# Patient Record
Sex: Male | Born: 1990 | Race: White | Hispanic: No | Marital: Married | Smoking: Never smoker
Health system: Southern US, Community
[De-identification: ages and names within clinical notes are randomized; demographics above are authoritative.]

## PROBLEM LIST (undated history)

## (undated) DIAGNOSIS — G4730 Sleep apnea, unspecified: Secondary | ICD-10-CM

## (undated) DIAGNOSIS — R55 Syncope and collapse: Secondary | ICD-10-CM

## (undated) HISTORY — PX: WISDOM TOOTH EXTRACTION: SHX21

## (undated) HISTORY — DX: Syncope and collapse: R55

---
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
## 2016-05-10 ENCOUNTER — Emergency Department (HOSPITAL_BASED_OUTPATIENT_CLINIC_OR_DEPARTMENT_OTHER): Payer: BLUE CROSS/BLUE SHIELD

## 2016-05-10 ENCOUNTER — Emergency Department (HOSPITAL_BASED_OUTPATIENT_CLINIC_OR_DEPARTMENT_OTHER)
Admission: EM | Admit: 2016-05-10 | Discharge: 2016-05-11 | Disposition: A | Discharge status: Home / Self Care | Payer: BLUE CROSS/BLUE SHIELD | Attending: Emergency Medicine | Admitting: Emergency Medicine

## 2016-05-10 ENCOUNTER — Encounter (HOSPITAL_BASED_OUTPATIENT_CLINIC_OR_DEPARTMENT_OTHER): Payer: Self-pay | Admitting: *Deleted

## 2016-05-10 DIAGNOSIS — S93401A Sprain of unspecified ligament of right ankle, initial encounter: Secondary | ICD-10-CM | POA: Insufficient documentation

## 2016-05-10 DIAGNOSIS — Y9389 Activity, other specified: Secondary | ICD-10-CM | POA: Insufficient documentation

## 2016-05-10 DIAGNOSIS — Y999 Unspecified external cause status: Secondary | ICD-10-CM | POA: Insufficient documentation

## 2016-05-10 DIAGNOSIS — Y929 Unspecified place or not applicable: Secondary | ICD-10-CM | POA: Diagnosis not present

## 2016-05-10 DIAGNOSIS — S99911A Unspecified injury of right ankle, initial encounter: Secondary | ICD-10-CM | POA: Diagnosis present

## 2016-05-10 DIAGNOSIS — X501XXA Overexertion from prolonged static or awkward postures, initial encounter: Secondary | ICD-10-CM | POA: Diagnosis not present

## 2016-05-10 NOTE — ED Triage Notes (Signed)
Right foot twisted on the way down steps.

## 2016-05-11 MED ORDER — DIAZEPAM 5 MG PO TABS
5.0000 mg | ORAL_TABLET | Freq: Once | ORAL | Status: AC
  Administered 2016-05-11: 5 mg via ORAL
  Filled 2016-05-11: qty 1

## 2016-05-11 MED ORDER — IBUPROFEN 600 MG PO TABS: 600.0000 mg | ORAL_TABLET | Freq: Four times a day (QID) | ORAL | 0 refills | Status: DC | PRN

## 2016-05-11 MED ORDER — METHOCARBAMOL 500 MG PO TABS: 500.0000 mg | ORAL_TABLET | Freq: Two times a day (BID) | ORAL | 0 refills | Status: DC

## 2016-05-11 NOTE — ED Notes (Signed)
ED Provider at bedside. 

## 2016-05-11 NOTE — Discharge Instructions (Signed)
Ice and elevate your ankle. Ibuprofen for pain. Robaxin for spasms. Follow with primary care doctor or orthopedic specialist.

## 2016-05-11 NOTE — ED Provider Notes (Signed)
[facility]   CSN: [id number] Arrival date & time: 05/10/16  2215     History   Chief Complaint Chief Complaint  Patient presents with  . Foot Injury    HPI John Weber is a 25 y.o. male.  HPI John Weber is a 25 y.o. male presents to emergency department complaining of right foot injury. Patient states he stepped down a step and rolled his right ankle. Patient states he heard a pop. He reports pain to the right ankle/foot. He denies numbness or weakness to his foot. He states he did not walk on it because he is worried it might be broken. Pain is sharp, in outside of his foot. Denies any other injuries. Has not tried any medications prior to coming in. Patient reports spasms and cramps in his right calf and right foot.   History reviewed. No pertinent past medical history.  There are no active problems to display for this patient.   History reviewed. No pertinent surgical history.     Home Medications    Prior to Admission medications   Not on File    Family History No family history on file.  Social History Social History  Substance Use Topics  . Smoking status: Never Smoker  . Smokeless tobacco: Never Used  . Alcohol use No     Allergies   Penicillins   Review of Systems Review of Systems  Constitutional: Negative for chills and fever.  Musculoskeletal: Positive for arthralgias and joint swelling.  Neurological: Negative for weakness and numbness.  All other systems reviewed and are negative.    Physical Exam Updated Vital Signs BP 142/96   Pulse 101   Temp 99.3 F (37.4 C) (Oral)   Resp 18   Ht 6\' 1"  (1.854 m)   Wt 95.3 kg   SpO2 100%   BMI 27.71 kg/m   Physical Exam  Constitutional: He appears well-developed and well-nourished. No distress.  HENT:  Head: Normocephalic and atraumatic.  Eyes: Conjunctivae are normal.  Neck: Neck supple.  Musculoskeletal: He exhibits no edema.  No obvious swelling to  the ankle or foot noted. No tenderness to palpation over medial lateral malleoli. No tenderness to palpation over just inferior to the lateral mild and fifth metatarsal. Pain with range of motion of the ankle, specifically with dorsiflexion and inversion of the ankle. Joint is stable. No tenderness or swelling over her Achilles tendon. Dorsal pedal pulses intact. Full range motion of all toes.  Neurological: He is alert.  Skin: Skin is warm and dry.  Nursing note and vitals reviewed.    ED Treatments / Results  Labs (all labs ordered are listed, but only abnormal results are displayed) Labs Reviewed - No data to display  EKG  EKG Interpretation None       Radiology Dg Foot Complete Right  Result Date: 05/10/2016 CLINICAL DATA:  Rolled right foot while walking down steps tonight. Lateral pain. EXAM: RIGHT FOOT COMPLETE - 3+ VIEW COMPARISON:  None. FINDINGS: There is no evidence of fracture or dislocation. There is no evidence of arthropathy or other focal bone abnormality. Soft tissues are unremarkable. IMPRESSION: Negative. Electronically Signed   By: Ellery Plunkaniel R Mitchell M.D.   On: 05/10/2016 22:51    Procedures Procedures (including critical care time)  Medications Ordered in ED Medications  diazepam (VALIUM) tablet 5 mg (not administered)     Initial Impression / Assessment and Plan / ED Course  I have reviewed the triage vital  signs and the nursing notes.  Pertinent labs & imaging results that were available during my care of the patient were reviewed by me and considered in my medical decision making (see chart for details).  Clinical Course     Patient is coming from home, complaining of right foot injury. X-ray of the foot is negative. NO tenderness over malleoli, do not think pt needs ankle xray.He is neurovascularly intact.  suspect possible ligamentous injury versus a sprain. We'll place an ASO, crutches provided. Patient is having spasms in the right calf and right  arch of the foot. I have witnessed some of this cramps while in the room. We'll treat with muscle relaxants. Will have patient follow with primary care doctor or orthopedic specialist for further evaluation and treatment.  Vitals:   05/10/16 2220 05/10/16 2222  BP:  142/96  Pulse:  101  Resp:  18  Temp:  99.3 F (37.4 C)  TempSrc:  Oral  SpO2:  100%  Weight: 95.3 kg   Height: 6\' 1"  (1.854 m)      Final Clinical Impressions(s) / ED Diagnoses   Final diagnoses:  Sprain of right ankle, unspecified ligament, initial encounter    New Prescriptions Discharge Medication List as of 05/11/2016 12:41 AM    START taking these medications   Details  ibuprofen (ADVIL,MOTRIN) 600 MG tablet Take 1 tablet (600 mg total) by mouth every 6 (six) hours as needed., Starting Tue 05/11/2016, Print    methocarbamol (ROBAXIN) 500 MG tablet Take 1 tablet (500 mg total) by mouth 2 (two) times daily., Starting Tue 05/11/2016, Print         Jaynie Crumbleatyana Jamarii Banks, PA-C 05/11/16 0104    Tomasita CrumbleAdeleke Oni, MD 05/11/16 [id number]

## 2016-10-20 ENCOUNTER — Emergency Department (HOSPITAL_COMMUNITY)
Admission: EM | Admit: 2016-10-20 | Discharge: 2016-10-20 | Disposition: A | Discharge status: Home / Self Care | Payer: BLUE CROSS/BLUE SHIELD | Attending: Emergency Medicine | Admitting: Emergency Medicine

## 2016-10-20 DIAGNOSIS — W1800XA Striking against unspecified object with subsequent fall, initial encounter: Secondary | ICD-10-CM | POA: Diagnosis not present

## 2016-10-20 DIAGNOSIS — R7989 Other specified abnormal findings of blood chemistry: Secondary | ICD-10-CM | POA: Diagnosis not present

## 2016-10-20 DIAGNOSIS — Y929 Unspecified place or not applicable: Secondary | ICD-10-CM | POA: Insufficient documentation

## 2016-10-20 DIAGNOSIS — Y9389 Activity, other specified: Secondary | ICD-10-CM | POA: Insufficient documentation

## 2016-10-20 DIAGNOSIS — S01511A Laceration without foreign body of lip, initial encounter: Secondary | ICD-10-CM | POA: Insufficient documentation

## 2016-10-20 DIAGNOSIS — R55 Syncope and collapse: Secondary | ICD-10-CM | POA: Diagnosis not present

## 2016-10-20 DIAGNOSIS — Z23 Encounter for immunization: Secondary | ICD-10-CM | POA: Diagnosis not present

## 2016-10-20 DIAGNOSIS — Y998 Other external cause status: Secondary | ICD-10-CM | POA: Diagnosis not present

## 2016-10-20 DIAGNOSIS — R945 Abnormal results of liver function studies: Secondary | ICD-10-CM

## 2016-10-20 LAB — CBC WITH DIFFERENTIAL/PLATELET
Basophils Absolute: 0.1 10*3/uL (ref 0.0–0.1)
Basophils Relative: 1 %
Eosinophils Absolute: 0.4 10*3/uL (ref 0.0–0.7)
Eosinophils Relative: 4 %
HCT: 44.5 % (ref 39.0–52.0)
Hemoglobin: 15.2 g/dL (ref 13.0–17.0)
LYMPHS PCT: 18 %
Lymphs Abs: 1.8 10*3/uL (ref 0.7–4.0)
MCH: 29.1 pg (ref 26.0–34.0)
MCHC: 34.2 g/dL (ref 30.0–36.0)
MCV: 85.2 fL (ref 78.0–100.0)
MONO ABS: 0.9 10*3/uL (ref 0.1–1.0)
MONOS PCT: 9 %
NEUTROS ABS: 6.8 10*3/uL (ref 1.7–7.7)
Neutrophils Relative %: 68 %
Platelets: 238 10*3/uL (ref 150–400)
RBC: 5.22 MIL/uL (ref 4.22–5.81)
RDW: 12.8 % (ref 11.5–15.5)
WBC: 9.9 10*3/uL (ref 4.0–10.5)

## 2016-10-20 LAB — COMPREHENSIVE METABOLIC PANEL
ALT: 100 U/L — ABNORMAL HIGH (ref 17–63)
ANION GAP: 7 (ref 5–15)
AST: 54 U/L — ABNORMAL HIGH (ref 15–41)
Albumin: 4.4 g/dL (ref 3.5–5.0)
Alkaline Phosphatase: 52 U/L (ref 38–126)
BILIRUBIN TOTAL: 0.8 mg/dL (ref 0.3–1.2)
BUN: 10 mg/dL (ref 6–20)
CO2: 27 mmol/L (ref 22–32)
Calcium: 9.1 mg/dL (ref 8.9–10.3)
Chloride: 102 mmol/L (ref 101–111)
Creatinine, Ser: 1.17 mg/dL (ref 0.61–1.24)
GFR calc Af Amer: >60 mL/min (ref >60)
Glucose, Bld: 109 mg/dL — ABNORMAL HIGH (ref 65–99)
Potassium: 3.7 mmol/L (ref 3.5–5.1)
Sodium: 136 mmol/L (ref 135–145)
TOTAL PROTEIN: 6.9 g/dL (ref 6.5–8.1)

## 2016-10-20 LAB — I-STAT TROPONIN, ED: TROPONIN I, POC: 0 ng/mL (ref 0.00–0.08)

## 2016-10-20 MED ORDER — TETANUS-DIPHTH-ACELL PERTUSSIS 5-2.5-18.5 LF-MCG/0.5 IM SUSP
0.5000 mL | Freq: Once | INTRAMUSCULAR | Status: AC
  Administered 2016-10-20: 0.5 mL via INTRAMUSCULAR
  Filled 2016-10-20: qty 0.5

## 2016-10-20 NOTE — Discharge Instructions (Signed)
Drink plenty of fluids. Apply Orajel or any other topical benzocaine to the lip pain. Rinse mouth after eating. Watch for any signs of infection. Follow with family doctor regarding elevated Liver Function Tests, prior palpitations, and this syncopal episode. Return if any worsening symptoms.

## 2016-10-20 NOTE — ED Triage Notes (Signed)
Pt arrived via ems from spouses work place after passing out and striking chin on floor. Pt has laceration to inside of lower lip and small abrasion beneath left eye. Pt was eating just prior to episode. Pt remembers only waking up on floor with a crowd standing around. Pt spouse states she saw the pt become rigid after hitting floor. Pt has no hx of cardiac, sz, or syncopal episodes. Pt is alert and oriented x4.

## 2016-10-20 NOTE — ED Provider Notes (Signed)
[facility] Provider Note   CSN: [id number] Arrival date & time: 10/20/16  1808     History   Chief Complaint Chief Complaint  Patient presents with  . Loss of Consciousness    HPI John Weber is a 26 y.o. male.  HPI John Weber is a 26 y.o. male presents to emergency department complaining of syncopal episode. Patient states he was eating food, remembers taking a big bite and having pain when he was swallowing, reports feeling dizzy, diaphoretic, and woke up on the floor. Patient's girlfriend is in the room with him and states that she witnessed him falling to the ground and being unconscious for several seconds. She states when he came back to it he was alert and oriented and did not know what happened. Patient denies any pain anywhere at this time. Denies dizziness. He admits to not drinking enough fluids today, and states he did have a beer earlier. Patient denies any chest pain or abdominal pain. No palpitations. Patient does report several episodes in the past where he would have heart racing and palpitations but states this usually lasts several seconds and improve on their own. He has not seen a cardiologist for this in the past. Patient complaining of pain to the left face and lower lip where he sustained laceration to the inner lip from his tooth.  No past medical history on file.  There are no active problems to display for this patient.   No past surgical history on file.     Home Medications    Prior to Admission medications   Medication Sig Start Date End Date Taking? Authorizing Provider  ibuprofen (ADVIL,MOTRIN) 600 MG tablet Take 1 tablet (600 mg total) by mouth every 6 (six) hours as needed. 05/11/16   Ardon Franklin, Lemont Fillers, PA-C  methocarbamol (ROBAXIN) 500 MG tablet Take 1 tablet (500 mg total) by mouth 2 (two) times daily. 05/11/16   Jaynie Crumble, PA-C    Family History No family history on file.  Social History Social History    Substance Use Topics  . Smoking status: Never Smoker  . Smokeless tobacco: Never Used  . Alcohol use No     Allergies   Penicillins   Review of Systems Review of Systems  Constitutional: Negative for chills and fever.  Respiratory: Negative for cough, chest tightness and shortness of breath.   Cardiovascular: Negative for chest pain, palpitations and leg swelling.  Gastrointestinal: Negative for abdominal distention, abdominal pain, diarrhea, nausea and vomiting.  Genitourinary: Negative for dysuria, frequency, hematuria and urgency.  Musculoskeletal: Negative for arthralgias, myalgias, neck pain and neck stiffness.  Skin: Positive for wound. Negative for rash.  Allergic/Immunologic: Negative for immunocompromised state.  Neurological: Positive for syncope and headaches. Negative for dizziness, weakness, light-headedness and numbness.  All other systems reviewed and are negative.    Physical Exam Updated Vital Signs BP 133/76   Pulse 88   Temp 97.5 F (36.4 C) (Oral)   Resp 17   Ht 6\' 1"  (1.854 m)   Wt 104.3 kg (230 lb)   SpO2 98%   BMI 30.34 kg/m   Physical Exam  Constitutional: He is oriented to person, place, and time. He appears well-developed and well-nourished. No distress.  HENT:  Head: Normocephalic and atraumatic.  Laceration to the oral mucosa of the lower lip. Measuring approximately 1 cm. Confusion around left eye from the glasses. Teeth are all intact, no loosening.  Eyes: Conjunctivae and EOM are normal. Pupils are equal, round, and  reactive to light.  Neck: Neck supple.  Cardiovascular: Normal rate, regular rhythm and normal heart sounds.   No murmur heard. Pulmonary/Chest: Effort normal. No respiratory distress. He has no wheezes. He has no rales.  Abdominal: Soft. Bowel sounds are normal. He exhibits no distension. There is no tenderness. There is no rebound.  Musculoskeletal: He exhibits no edema.  Neurological: He is alert and oriented to  person, place, and time. He displays normal reflexes. No cranial nerve deficit. Coordination normal.  Skin: Skin is warm and dry.  Nursing note and vitals reviewed.    ED Treatments / Results  Labs (all labs ordered are listed, but only abnormal results are displayed) Labs Reviewed  COMPREHENSIVE METABOLIC PANEL - Abnormal; Notable for the following:       Result Value   Glucose, Bld 109 (*)    AST 54 (*)    ALT 100 (*)    All other components within normal limits  CBC WITH DIFFERENTIAL/PLATELET  Rosezena SensorI-STAT TROPOININ, ED    EKG  EKG Interpretation  Date/Time:  Wednesday Oct 20 2016 18:24:56 EDT Ventricular Rate:  88 PR Interval:    QRS Duration: 99 QT Interval:  360 QTC Calculation: 436 R Axis:   74 Text Interpretation:  Sinus rhythm No evidence of WPW.  When compared to prior, t waves now upright in leads AVL, V1, and V2.  No STEMI Confirmed by Theda Belfastegeler, Chris ([id number]) on 10/20/2016 7:42:41 PM       Radiology No results found.  Procedures Procedures (including critical care time)  Medications Ordered in ED Medications  Tdap (BOOSTRIX) injection 0.5 mL (0.5 mLs Intramuscular Given 10/20/16 2053)     Initial Impression / Assessment and Plan / ED Course  I have reviewed the triage vital signs and the nursing notes.  Pertinent labs & imaging results that were available during my care of the patient were reviewed by me and considered in my medical decision making (see chart for details).    Patient with syncopal episode after swallowing a large food bolus and feeling pain in his chest. We'll check labs including EKG, troponin. Will monitor on the cardiac monitor. Patient is currently asymptomatic.   8:37 PM EKG is normal. Labs unremarkable other than elevated LFTs. Patient is overweight, question fatty liver versus from alcohol intake. He admits to drinking beer today. He will need further workup for these LFTs. Hold results discussed with patient. Tetanus updated.  Advised to follow-up with family doctor regarding elevated LFTs and palpitations that he has been feeling. Advised to increase fluid intake. Return if any worsening symptoms.   Vitals:   10/20/16 1809 10/20/16 1900  BP: 120/72 133/76  Pulse: 89 88  Resp: 16 17  Temp: 97.5 F (36.4 C)   TempSrc: Oral   SpO2: 98% 98%  Weight: 104.3 kg (230 lb)   Height: 6\' 1"  (1.854 m)      Final Clinical Impressions(s) / ED Diagnoses   Final diagnoses:  Syncope, unspecified syncope type  Lip laceration, initial encounter  Elevated LFTs    New Prescriptions Discharge Medication List as of 10/20/2016  8:39 PM       Jaynie CrumbleKirichenko, Daylen Hack, PA-C 10/20/16 2348    Tegeler, Canary Brimhristopher J, MD 10/21/16 [phone]

## 2016-10-28 ENCOUNTER — Encounter: Payer: Self-pay | Admitting: Cardiovascular Disease

## 2016-10-28 ENCOUNTER — Ambulatory Visit (INDEPENDENT_AMBULATORY_CARE_PROVIDER_SITE_OTHER): Payer: BLUE CROSS/BLUE SHIELD | Admitting: Cardiovascular Disease

## 2016-10-28 VITALS — BP 128/82 | HR 67 | Ht 73.0 in | Wt 230.0 lb

## 2016-10-28 DIAGNOSIS — Z13220 Encounter for screening for lipoid disorders: Secondary | ICD-10-CM | POA: Diagnosis not present

## 2016-10-28 DIAGNOSIS — R945 Abnormal results of liver function studies: Secondary | ICD-10-CM

## 2016-10-28 DIAGNOSIS — R002 Palpitations: Secondary | ICD-10-CM | POA: Diagnosis not present

## 2016-10-28 DIAGNOSIS — R0683 Snoring: Secondary | ICD-10-CM

## 2016-10-28 DIAGNOSIS — R7989 Other specified abnormal findings of blood chemistry: Secondary | ICD-10-CM

## 2016-10-28 DIAGNOSIS — R55 Syncope and collapse: Secondary | ICD-10-CM | POA: Diagnosis not present

## 2016-10-28 DIAGNOSIS — E669 Obesity, unspecified: Secondary | ICD-10-CM

## 2016-10-28 MED ORDER — METOPROLOL TARTRATE 50 MG PO TABS: ORAL_TABLET | ORAL | 3 refills | Status: DC

## 2016-10-28 NOTE — Progress Notes (Signed)
Cardiology Office Note    Date:  10/30/2016   ID:  [name], DOB Feb 18, 1991, MRN [id number]  PCP:  Deland Pretty, MD  Cardiologist:  Shelva Majestic, MD   Chief Complaint  Patient presents with  . New Patient (Initial Visit)    pt states some SOB but not to often, states fainting last week      History of Present Illness:  John Weber is a 26 y.o. male who presents for cardiology evaluation of recent palpitations.  Mr. [name] is a 26 year old male who is the son of my patient, Devlin Weber, who I diagnosed Wolff-Parkinson-White syndrome in the early 1990s and he had undergone successful ablation.  The patient denies any known heart issues.  However, he has noticed palpitations intermittently over the past year.  He has noticed that his heart races typically when he walks up steps.  He has A. fib.,  And he tells me that on 10/14/2016.  His heart was racing while playing golf and on his feet but recorded it registered 190 bpm, and according to the patient.  It lasted for up to 5 minutes.  He denied associated chest pain or significant shortness of breath.  On May 30.  He presented to the emergency room following a syncopal episode.  The patient states that he was eating food, remembers taking a big bite, and when he was trying to swallow had pain with swallowing, felt dizzy, diaphoretic and the next thing he knew he was on the floor.  This event was witnessed by his wife and states he was unconscious for several seconds.  There was no obvious seizure activity.  When he came to he was alert and oriented, but did not know what happened.  He had not had many fluids that day, but had had some beer.  He also admits to previously having significant caffeine use, both with coffee, red bull, and [facility].  He sustained a slight laceration of his lower lip with the fall.  When he was seen in the emergency room, his blood pressure was 133/76 and pulse was 88.  His ECG was unremarkable.  There was no  evidence for preexcitation.  Labs were unremarkable with the exception of elevated liver function tests.  Subsequenty, he has avoided caffeine use.  He now presents for cardiology evaluation.   No past medical history on file.  No past surgical history on file.   He has an allergy to penicillin.  Current Medications: Outpatient Medications Prior to Visit  Medication Sig Dispense Refill  . ibuprofen (ADVIL,MOTRIN) 600 MG tablet Take 1 tablet (600 mg total) by mouth every 6 (six) hours as needed. 30 tablet 0  . methocarbamol (ROBAXIN) 500 MG tablet Take 1 tablet (500 mg total) by mouth 2 (two) times daily. 20 tablet 0  . fexofenadine (ALLEGRA) 180 MG tablet Take 1 tablet by mouth daily.     No facility-administered medications prior to visit.      Allergies:   Penicillins   Social History   Social History  . Marital status: Married    Spouse name: N/A  . Number of children: N/A  . Years of education: N/A   Social History Main Topics  . Smoking status: Never Smoker  . Smokeless tobacco: Never Used  . Alcohol use No  . Drug use: Unknown  . Sexual activity: Not Asked   Other Topics Concern  . None   Social History Narrative  . None  Additional social history is notable that he is married for 4 years.  He has one child age 43-1/2.  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  He completed 12th grade of education.  He does not smoke cigarettes but does smoke vapor cigarettes.  He does not routinely exercise.  He does drink occasional beer.  Family History:  His mother is age 34.  His father is age 48.  His father was diagnosed with Wolff-Parkinson-White syndrome and underwent successful catheter ablation in 1992.  He has 2 brothers and one sister who are alive and well.  ROS General: Negative; No fevers, chills, or night sweats;  HEENT: Negative; No changes in vision or hearing, sinus congestion, difficulty swallowing Pulmonary: Negative; No cough, wheezing, shortness of breath,  hemoptysis Cardiovascular: see HPI GI: Negative; No nausea, vomiting, diarrhea, or abdominal pain GU: Negative; No dysuria, hematuria, or difficulty voiding Musculoskeletal: Negative; no myalgias, joint pain, or weakness Hematologic/Oncology: Negative; no easy bruising, bleeding Endocrine: Negative; no heat/cold intolerance; no diabetes Neuro: Negative; no changes in balance, headaches Skin: Negative; No rashes or skin lesions Psychiatric: Negative; No behavioral problems, depression Sleep: At times he has difficulty sleeping and wakes up frequently and does snore.; daytime sleepiness, hypersomnolence, bruxism, restless legs, hypnogognic hallucinations, no cataplexy Other comprehensive 14 point system review is negative.   PHYSICAL EXAM:   VS:  BP 128/82   Pulse 67   Ht 6' 1"  (1.854 m)   Wt 230 lb (104.3 kg)   BMI 30.34 kg/m     Repeat blood pressure by me 124/66, supine and 128/70 standing Wt Readings from Last 3 Encounters:  10/28/16 230 lb (104.3 kg)  10/20/16 230 lb (104.3 kg)  05/10/16 210 lb (95.3 kg)    General: Alert, oriented, no distress.  Skin: normal turgor, no rashes, warm and dry HEENT: Normocephalic, atraumatic. Pupils equal round and reactive to light; sclera anicteric; extraocular muscles intact; Fundi No hemorrhages or exudates.  Disc flat Nose without nasal septal hypertrophy Mouth/Parynx benign; Mallinpatti scale 2 Neck: No JVD, no carotid bruits; normal carotid upstroke Lungs: clear to ausculatation and percussion; no wheezing or rales Chest wall: without tenderness to palpitation Heart: PMI not displaced, RRR, s1 s2 normal, 1/6 systolic murmur, no diastolic murmur, no rubs, gallops, thrills, or heaves Abdomen: soft, nontender; no hepatosplenomehaly, BS+; abdominal aorta nontender and not dilated by palpation. Back: no CVA tenderness Pulses 2+ Musculoskeletal: full range of motion, normal strength, no joint deformities Extremities: no clubbing cyanosis or  edema, Homan's sign negative  Neurologic: grossly nonfocal; Cranial nerves grossly wnl Psychologic: Normal mood and affect   Studies/Labs Reviewed:   EKG:  EKG is ordered today.  ECG (independently read by me):Normal sinus rhythm at 67 bpm.  PR interval 144 ms.  QTc interval 407 ms.  No evidence for preexcitation.  No significant ST-T changes.  Recent Labs: BMP Latest Ref Rng & Units 10/20/2016  Glucose 65 - 99 mg/dL 109(H)  BUN 6 - 20 mg/dL 10  Creatinine 0.61 - 1.24 mg/dL 1.17  Sodium 135 - 145 mmol/L 136  Potassium 3.5 - 5.1 mmol/L 3.7  Chloride 101 - 111 mmol/L 102  CO2 22 - 32 mmol/L 27  Calcium 8.9 - 10.3 mg/dL 9.1     Hepatic Function Latest Ref Rng & Units 10/20/2016  Total Protein 6.5 - 8.1 g/dL 6.9  Albumin 3.5 - 5.0 g/dL 4.4  AST 15 - 41 U/L 54(H)  ALT 17 - 63 U/L 100(H)  Alk Phosphatase 38 -  126 U/L 52  Total Bilirubin 0.3 - 1.2 mg/dL 0.8    CBC Latest Ref Rng & Units 10/20/2016  WBC 4.0 - 10.5 K/uL 9.9  Hemoglobin 13.0 - 17.0 g/dL 15.2  Hematocrit 39.0 - 52.0 % 44.5  Platelets 150 - 400 K/uL 238   Lab Results  Component Value Date   MCV 85.2 10/20/2016   No results found for: TSH No results found for: HGBA1C   BNP No results found for: BNP  ProBNP No results found for: PROBNP   Lipid Panel  No results found for: CHOL, TRIG, HDL, CHOLHDL, VLDL, LDLCALC, LDLDIRECT   RADIOLOGY: No results found.   Additional studies/ records that were reviewed today include:  I reviewed the records from the emergency room and his prior ECGs.    ASSESSMENT:    1. Syncope, unspecified syncope type   2. Palpitations   3. Screening, lipid   4. Elevated liver function tests   5. Mild obesity   6. Snoring      PLAN:  Mr. Jakwan Sally is a 26 year old male who has had a history of intermittent palpitations but previously admitted to significant caffeine intake, both with coffee as well as red bull and [facility].  He recently experienced a short syncopal  spell after swallowing a bolus of food would seem to stick in his throat and he became diaphoretic and shortly blacked out.  There is a family history for preexcitation in his father who was diagnosed with Wolff-Parkinson-White syndrome and subsequently has undergone successful radiofrequency ablation.  The patient now is no longer using caffeine.  He admits to significant decrease in his previous palpitations.  He was noted to have elevation of liver function studies on his ER evaluation.  He is mildly obese and certainly this potentially may be related to fatty liver.  He admits to drinking occasional beer but not significantly.  His blood pressure today is stable and he is not orthostatic.  I have recommended a 2-D echo Doppler study to evaluate for structural heart disease, systolic and diastolic function and valvular architecture.  I will schedule him to wear a 30 day event monitor to see if we can pick up any abnormality.  I am repeating a chemistry profile, lipid studies, TSH and magnesium level.  If his LFTs continue to be elevated, I will schedule him for a liver ultrasound.  I am giving him a prescription for metoprolol, tartrate to take 25-50 mg on an as-needed basis if recurrent palpitations developed and are persistent.  He has noted some difficulty with sleep and has snoring.  I advised that he pay additional attention to this, particularly if he notes apneic spells or gasping for breath.  He may ultimately need a sleep study for further evaluation.  I will see him back in the office in 6-8 weeks for reevaluation.   Medication Adjustments/Labs and Tests Ordered: Current medicines are reviewed at length with the patient today.  Concerns regarding medicines are outlined above.  Medication changes, Labs and Tests ordered today are listed in the Patient Instructions below. Patient Instructions  Your physician has requested that you have an echocardiogram. Echocardiography is a painless test that uses  sound waves to create images of your heart. It provides your doctor with information about the size and shape of your heart and how well your heart's chambers and valves are working. This procedure takes approximately one hour. There are no restrictions for this procedure. This will be done at  The  [address] [facility].  Your physician has recommended that you wear 30 day event monitor for palpitations and syncope.. Event monitors are medical devices that record the heart's electrical activity. Doctors most often [place] these monitors to diagnose arrhythmias. Arrhythmias are problems with the speed or rhythm of the heartbeat. The monitor is a small, portable device. You can wear one while you do your normal daily activities. This is usually used to diagnose what is causing palpitations/syncope  (passing out).   Your physician recommends that you return for lab work -FASTING.   Your doctor has ordered you to have blood work. You may go to any lab you choose, however there is a [facility] lab located [place]. Check in with the front office staff. NO APPOINTMENT IS NEED. However they are usually gone between the hours of 1-2 for lunch. The Nurse/Medical Assistant will give you instructions on the need to fast.  Your physician has recommended you make the following change in your medication:   1.) START NEW PRESCRIPTION FOR LOPRESSOR ( metoprolol tart)as directed on the bottle. This has been sent to your pharmacy.  Your physician recommends that you schedule a follow-up appointment in: 6-8 weeks.         Signed, Shelva Majestic, MD  10/30/2016 1:54 PM    [facility] [address], [place], [place], [place]  [address] Phone: [phone]

## 2016-10-28 NOTE — Patient Instructions (Addendum)
Your physician has requested that you have an echocardiogram. Echocardiography is a painless test that uses sound waves to create images of your heart. It provides your doctor with information about the size and shape of your heart and how well your heart's chambers and valves are working. This procedure takes approximately one hour. There are no restrictions for this procedure. [facility]  Your physician has recommended that you wear 30 day event monitor for palpitations and syncope.. Event monitors are medical devices that record the heart's electrical activity. Doctors most often us these monitors to diagnose arrhythmias. Arrhythmias are problems with the speed or rhythm of the heartbeat. The monitor is a small, portable device. You can wear one while you do your normal daily activities. This is usually used to diagnose what is causing palpitations/syncope  (passing out).   Your physician recommends that you return for lab work -FASTING.   Your doctor has ordered you to have blood work. You may go to any lab you choose, however there is a [facility] lab located HERE IN OUR OFFICE. Check in with the front office staff. NO APPOINTMENT IS NEED. However they are usually gone between the hours of 1-2 for lunch. The Nurse/Medical Assistant will give you instructions on the need to fast.  Your physician has recommended you make the following change in your medication:   1.) START NEW PRESCRIPTION FOR LOPRESSOR ( metoprolol tart)as directed on the bottle. This has been sent to your pharmacy.  Your physician recommends that you schedule a follow-up appointment in: 6-8 weeks.

## 2016-11-10 ENCOUNTER — Other Ambulatory Visit: Payer: Self-pay

## 2016-11-10 ENCOUNTER — Ambulatory Visit (HOSPITAL_COMMUNITY): Payer: BLUE CROSS/BLUE SHIELD | Attending: Internal Medicine

## 2016-11-10 DIAGNOSIS — R55 Syncope and collapse: Secondary | ICD-10-CM | POA: Insufficient documentation

## 2016-11-10 DIAGNOSIS — R002 Palpitations: Secondary | ICD-10-CM | POA: Insufficient documentation

## 2016-11-11 ENCOUNTER — Ambulatory Visit (INDEPENDENT_AMBULATORY_CARE_PROVIDER_SITE_OTHER): Payer: BLUE CROSS/BLUE SHIELD

## 2016-11-11 DIAGNOSIS — R55 Syncope and collapse: Secondary | ICD-10-CM

## 2016-11-11 DIAGNOSIS — R002 Palpitations: Secondary | ICD-10-CM

## 2017-01-28 ENCOUNTER — Other Ambulatory Visit: Payer: Self-pay | Admitting: Internal Medicine

## 2017-01-28 DIAGNOSIS — R945 Abnormal results of liver function studies: Secondary | ICD-10-CM

## 2017-01-28 DIAGNOSIS — R7989 Other specified abnormal findings of blood chemistry: Secondary | ICD-10-CM

## 2017-02-02 ENCOUNTER — Ambulatory Visit
Admission: RE | Admit: 2017-02-02 | Discharge: 2017-02-02 | Disposition: A | Discharge status: Home / Self Care | Payer: BLUE CROSS/BLUE SHIELD | Source: Ambulatory Visit | Attending: Internal Medicine | Admitting: Internal Medicine

## 2017-02-02 DIAGNOSIS — R7989 Other specified abnormal findings of blood chemistry: Secondary | ICD-10-CM

## 2017-02-02 DIAGNOSIS — R945 Abnormal results of liver function studies: Secondary | ICD-10-CM

## 2017-02-03 ENCOUNTER — Other Ambulatory Visit: Payer: Self-pay | Admitting: Internal Medicine

## 2017-02-03 DIAGNOSIS — K7689 Other specified diseases of liver: Secondary | ICD-10-CM

## 2017-02-16 ENCOUNTER — Ambulatory Visit
Admission: RE | Admit: 2017-02-16 | Discharge: 2017-02-16 | Disposition: A | Discharge status: Home / Self Care | Payer: BLUE CROSS/BLUE SHIELD | Source: Ambulatory Visit | Attending: Internal Medicine | Admitting: Internal Medicine

## 2017-02-16 DIAGNOSIS — K7689 Other specified diseases of liver: Secondary | ICD-10-CM

## 2017-02-16 MED ORDER — GADOBENATE DIMEGLUMINE 529 MG/ML IV SOLN
20.0000 mL | Freq: Once | INTRAVENOUS | Status: AC | PRN
  Administered 2017-02-16: 20 mL via INTRAVENOUS

## 2017-07-13 ENCOUNTER — Other Ambulatory Visit: Payer: Self-pay | Admitting: Otolaryngology

## 2017-07-13 DIAGNOSIS — R1310 Dysphagia, unspecified: Secondary | ICD-10-CM

## 2017-07-13 DIAGNOSIS — R1319 Other dysphagia: Secondary | ICD-10-CM

## 2017-07-15 ENCOUNTER — Ambulatory Visit
Admission: RE | Admit: 2017-07-15 | Discharge: 2017-07-15 | Disposition: A | Discharge status: Home / Self Care | Payer: BLUE CROSS/BLUE SHIELD | Source: Ambulatory Visit | Attending: Otolaryngology | Admitting: Otolaryngology

## 2017-07-15 DIAGNOSIS — R1319 Other dysphagia: Secondary | ICD-10-CM

## 2017-07-15 DIAGNOSIS — R1310 Dysphagia, unspecified: Secondary | ICD-10-CM

## 2018-02-16 ENCOUNTER — Encounter: Payer: Self-pay | Admitting: Neurology

## 2018-02-20 ENCOUNTER — Encounter: Payer: Self-pay | Admitting: Neurology

## 2018-02-20 ENCOUNTER — Ambulatory Visit: Payer: BLUE CROSS/BLUE SHIELD | Admitting: Neurology

## 2018-02-20 VITALS — BP 135/85 | HR 69 | Ht 73.0 in | Wt 230.0 lb

## 2018-02-20 DIAGNOSIS — R55 Syncope and collapse: Secondary | ICD-10-CM

## 2018-02-20 DIAGNOSIS — R002 Palpitations: Secondary | ICD-10-CM | POA: Diagnosis not present

## 2018-02-20 DIAGNOSIS — R0683 Snoring: Secondary | ICD-10-CM | POA: Diagnosis not present

## 2018-02-20 DIAGNOSIS — G4730 Sleep apnea, unspecified: Secondary | ICD-10-CM | POA: Diagnosis not present

## 2018-02-20 DIAGNOSIS — R5383 Other fatigue: Secondary | ICD-10-CM

## 2018-02-20 NOTE — Progress Notes (Signed)
SLEEP MEDICINE CLINIC   Provider:  Melvyn Novas, M D  Primary Care Physician:  Creola Corn, MD   Referring Provider: Creola Corn, MD    Chief Complaint  Patient presents with  . Referral    Referral from Dr Creola Corn for sleep apnea snoring , anxiety attacks, heart palpitations, racing heart,no prior sleep study room 10 pt alone    HPI:  John Weber is a 27 y.o. male patient of Dr. Ferd Hibbs is seen on 02-20-2018 for a sleep evaluation.   Chief complaint according to patient : " I snore more over the last 2 years and my sleep is not as refreshing". "I probably gained 15 pounds in the timeframe" .  " I transferred to a new, a lot more stressful job ". "I had some of the symptoms my father had with WPW, one syncope last summer."   Sleep habits are as follows: Dinner time is between 6-6.30 PM, 63 year old daughter is in bed by 7.30 PM , he and his wife take care of the horse before bedtime.  Bedtime is about 9.30 PM, in a cool, quiet and dark room. He sleeps on his side and prone. He uses 2-3 pillows.  He falls asleep easily, and stays asleep except for one time nocturia. His snoring gets louder when he sleeps on his back- he pauses in his breathing pattern-but he can always go back to sleep unless its already 5 AM. Sometimes he is air hungry, may have palpitations.   He rises between 5.30- 6 AM with an alarm, he reports he needs the alarm.  He is not refreshed, no headaches, but some nausea is often present. He feels congested. He sleeps 7-8 hours but the quality is not as good.    Sleep medical history and family sleep history: no other family member with a dx of OSA, no history of neck or ENT surgeries or trauma, no tonsillectomy. Father had WPW ablation with Dr. Tresa Endo. Patient had 2 syncopes- not related.   Social history: married, one child - a daughter 76 years old.   Review of Systems: Out of a complete 14 system review, the patient complains of only the following symptoms,  and all other reviewed systems are negative.  Snoring. witnessed apnea. Syncope.  Epworth score: 5/ 24   , Fatigue severity score 29/ 64  , depression score N/A    Social History   Socioeconomic History  . Marital status: Married    Spouse name: Not on file  . Number of children: Not on file  . Years of education: Not on file  . Highest education level: Not on file  Occupational History  . Not on file  Social Needs  . Financial resource strain: Not on file  . Food insecurity:    Worry: Not on file    Inability: Not on file  . Transportation needs:    Medical: Not on file    Non-medical: Not on file  Tobacco Use  . Smoking status: Never Smoker  . Smokeless tobacco: Never Used  Substance and Sexual Activity  . Alcohol use: No  . Drug use: Not on file  . Sexual activity: Not on file  Lifestyle  . Physical activity:    Days per week: Not on file    Minutes per session: Not on file  . Stress: Not on file  Relationships  . Social connections:    Talks on phone: Not on file    Gets together:  Not on file    Attends religious service: Not on file    Active member of club or organization: Not on file    Attends meetings of clubs or organizations: Not on file    Relationship status: Not on file  . Intimate partner violence:    Fear of current or ex partner: Not on file    Emotionally abused: Not on file    Physically abused: Not on file    Forced sexual activity: Not on file  Other Topics Concern  . Not on file  Social History Narrative  . Not on file    Family History  Problem Relation Age of Onset  . Rectal cancer Father     Past Medical History:  Diagnosis Date  . Syncope     Past Surgical History:  Procedure Laterality Date  . WISDOM TOOTH EXTRACTION      Current Outpatient Medications  Medication Sig Dispense Refill  . fexofenadine (ALLEGRA) 60 MG tablet Take by mouth as needed.     Marland Kitchen ibuprofen (ADVIL,MOTRIN) 600 MG tablet Take 1 tablet (600 mg  total) by mouth every 6 (six) hours as needed. 30 tablet 0  . methocarbamol (ROBAXIN) 500 MG tablet Take 1 tablet (500 mg total) by mouth 2 (two) times daily. 20 tablet 0  . metoprolol tartrate (LOPRESSOR) 50 MG tablet Take 1/2 tablet to1 tablet as needed for palpitations. 30 tablet 3   No current facility-administered medications for this visit.     Allergies as of 02/20/2018 - Review Complete 02/20/2018  Allergen Reaction Noted  . Penicillins Anaphylaxis 05/10/2016    Vitals: BP 135/85 (BP Location: Right Arm, Patient Position: Sitting, Cuff Size: Normal)   Pulse 69   Ht 6\' 1"  (1.854 m)   Wt 230 lb (104.3 kg)   BMI 30.34 kg/m  Last Weight:  Wt Readings from Last 1 Encounters:  02/20/18 230 lb (104.3 kg)   GMW:NUUV mass index is 30.34 kg/m.     Last Height:   Ht Readings from Last 1 Encounters:  02/20/18 6\' 1"  (1.854 m)    Physical exam:  General: The patient is awake, alert and appears not in acute distress. The patient is well groomed. Head: Normocephalic, atraumatic. Neck is supple. Mallampati 3- elongated uvula. neck circumference:18. 5. Nasal airflow congested. Retrognathia is seen.  Cardiovascular:  Regular rate and rhythm, without  murmurs or carotid bruit, and without distended neck veins. Respiratory: Lungs are clear to auscultation. Skin:  Without evidence of edema, or rash Trunk: BMI is 30.3  The patient's posture is erect.  Neurologic exam : The patient is awake and alert, oriented to place and time.   Attention span & concentration ability appears normal.  Speech is fluent,  without  dysarthria, dysphonia or aphasia.  Mood and affect are appropriate.  Cranial nerves: Pupils are equal and briskly reactive to light.  Funduscopic exam without evidence of pallor or edema.  Extraocular movements  in vertical and horizontal planes intact and without nystagmus. Visual fields by finger perimetry are intact. Hearing to finger rub intact. Facial sensation intact to  fine touch. Facial motor strength is symmetric and tongue and uvula move midline. Shoulder shrug was symmetrical.   Motor exam: Normal tone, muscle bulk and symmetric strength in all extremities. Sensory:  Fine touch, pinprick and vibration were tested in all extremities. Proprioception tested in the upper extremities was normal. Coordination: Rapid alternating movements in the fingers/hands was normal. Finger-to-nose maneuver  normal without evidence of ataxia,  dysmetria or tremor. Gait and station: Patient walks without assistive device. Strength within normal limits. Stance is stable and normal. Romberg testing is negative. Deep tendon reflexes: in the  upper and lower extremities are symmetric and intact. Babinski maneuver response is downgoing.   Assessment:  After physical and neurologic examination, review of laboratory studies,  Personal review of imaging studies, reports of other /same  Imaging studies, results of polysomnography and / or neurophysiology testing and pre-existing records as far as provided in visit., my assessment is   1)  Snoring and witnessed apnea are the give away in this case. We just need to check out for acardiac arrhythmias in correlation to these apneas.  We can correlate with Hr and apnea , desaturation if we use an attended sleep study.   The patient was advised of the nature of the diagnosed disorder , the treatment options and the  risks for general health and wellness arising from not treating the condition.   I spent more than 45 minutes of face to face time with the patient.  Greater than 50% of time was spent in counseling and coordination of care. We have discussed the diagnosis and differential and I answered the patient's questions.    Plan:  Treatment plan and additional workup : Attended sleep study, SPLIT at 3 % and AHI if it reaches 20. Watch for heart rate and rhythm.   Melvyn Novas, MD   02/20/2018, 9:11 AM  Certified in Neurology by  ABPN Certified in Sleep Medicine by [facility] [address] [place], [place] [address]

## 2018-03-14 ENCOUNTER — Ambulatory Visit (INDEPENDENT_AMBULATORY_CARE_PROVIDER_SITE_OTHER): Payer: BLUE CROSS/BLUE SHIELD | Admitting: Neurology

## 2018-03-14 DIAGNOSIS — G4733 Obstructive sleep apnea (adult) (pediatric): Secondary | ICD-10-CM | POA: Diagnosis not present

## 2018-03-14 DIAGNOSIS — R0683 Snoring: Secondary | ICD-10-CM

## 2018-03-14 DIAGNOSIS — R002 Palpitations: Secondary | ICD-10-CM

## 2018-03-14 DIAGNOSIS — R5383 Other fatigue: Secondary | ICD-10-CM

## 2018-03-14 DIAGNOSIS — G4730 Sleep apnea, unspecified: Secondary | ICD-10-CM

## 2018-03-14 DIAGNOSIS — R55 Syncope and collapse: Secondary | ICD-10-CM

## 2018-03-20 ENCOUNTER — Telehealth: Payer: Self-pay | Admitting: Neurology

## 2018-03-20 ENCOUNTER — Encounter: Payer: Self-pay | Admitting: Neurology

## 2018-03-20 NOTE — Telephone Encounter (Signed)
I called pt. I advised pt that Dr. Dohmeier reviewed their sleep study results and found that has mild to moderate sleep apnea and recommends that pt be treated with a cpap. Dr. Dohmeier recommends that pt return for a repeat sleep study in order to properly titrate the cpap and ensure a good mask fit. Pt is agreeable to returning for a titration study. I advised pt that our sleep lab will file with pt's insurance and call pt to schedule the sleep study when we hear back from the pt's insurance regarding coverage of this sleep study. Pt verbalized understanding of results. Pt had no questions at this time but was encouraged to call back if questions arise.   

## 2018-03-20 NOTE — Addendum Note (Signed)
Addended by: Melvyn Novas on: 03/20/2018 09:06 AM   Modules accepted: Orders

## 2018-03-20 NOTE — Procedures (Signed)
PATIENT'S NAME:  John Weber, John Weber DOB:      18-Oct-1990      MR#:    [id number]     DATE OF RECORDING: 03/14/2018 REFERRING M.D.:  Creola Corn, M.D. Study Performed:   Baseline Polysomnogram HISTORY:  John Weber is a 27 y.o. male patient of Dr. Ferd Hibbs, seen on 02-20-2018 for a sleep evaluation. Chief complaint according to patient: "I snore more over the last 2 years and my sleep is not as refreshing". "I probably gained 15 pounds in the timeframe", also:" I transferred to a new, a lot more stressful job "and "I had some of the symptoms my father had with WPW, one syncope last summer."  Bedtime is about 9.30 PM, in a cool, quiet and dark room. He sleeps on his side and prone. He uses 2-3 pillows. Sometimes he is air hungry, may have palpitations. He rises between 5.30- 6 AM with an alarm, which he reports he needs. He is not refreshed, has no headaches, but some nausea is often present. He feels congested. He sleeps 7-8 hours but the quality is not as good.   The patient endorsed the Epworth Sleepiness Scale at 5/24 points, the FSS (Fatigue Severity Score) at 29/ 63 points.  The patient's weight 230 pounds with a height of 73 (inches), resulting in a BMI of 30.4 kg/m2. The patient's neck circumference measured 18.5 inches.  CURRENT MEDICATIONS: Allegra, Advil, Robaxin, Lopressor   PROCEDURE:  This is a multichannel digital polysomnogram utilizing the Somnostar 11.2 system.  Electrodes and sensors were applied and monitored per AASM Specifications.   EEG, EOG, Chin and Limb EMG, were sampled at 200 Hz.  ECG, Snore and Nasal Pressure, Thermal Airflow, Respiratory Effort, CPAP Flow and Pressure, Oximetry was sampled at 50 Hz. Digital video and audio were recorded.      BASELINE STUDY Lights Out was at 22:20 and Lights On at 05:23.  Total recording time (TRT) was 424 minutes, with a total sleep time (TST) of 339 minutes.   The patient's sleep latency was 44.5 minutes.  REM latency was 333 minutes.  The sleep  efficiency was 80. %.     SLEEP ARCHITECTURE: WASO (Wake after sleep onset) was 62.5 minutes.  There were 10 minutes in Stage N1, 236.5 minutes Stage N2, 85.5 minutes Stage N3 and 7 minutes in Stage REM.  The percentage of Stage N1 was 2.9%, Stage N2 was 69.8%, Stage N3 was 25.2% and Stage R (REM sleep) was 2.1%.  RESPIRATORY ANALYSIS:  There were a total of 98 respiratory events:  2 obstructive apneas, 0 central apneas and 0 mixed apneas with a total of 2 apneas and an apnea index (AI) of 0.4 /hour. There were 96 hypopneas with a hypopnea index of 17.0 /hour. The patient also had 0 respiratory event related arousals (RERAs). The total APNEA/HYPOPNEA INDEX (AHI) was 17.3 /hour and the total RESPIRATORY DISTURBANCE INDEX was 17.3 /hour.  4 events occurred in REM sleep and 184 events in NREM. The REM AHI was 34.3 /hour, versus a non-REM AHI of 17.0/h. The patient spent 98 minutes of total sleep time in the supine position and 241 minutes in non-supine. The supine AHI was 37.3/h versus a non-supine AHI of 9.2.  OXYGEN SATURATION & C02:  The Wake baseline 02 saturation was 96%, with the lowest being 89%. Time spent below 89% saturation equaled 0 minutes.   AROUSALS/ PERIODIC LIMB MOVEMENTS:  The arousals were noted as: 268 were spontaneous, 0 were associated with PLMs,  and 81 were associated with respiratory events. The patient had a total of 0 Periodic Limb Movements.    Audio and video analysis did not show any abnormal or unusual movements, behaviors, phonations or vocalizations.   Loud Snoring was noted. EKG was in keeping with normal sinus rhythm (NSR).  Post-study, the patient indicated that sleep was the same as usual.    IMPRESSION:  1. Mild- moderate Obstructive Sleep Apnea (OSA) at AHI of 17.3/h. REM AHI was 34.3/ h and will make use of a dental device less promising.  2. Primary Snoring. The patient had problems with nasal airway patency, and this may have affected the snoring volume.   3. Very little REM sleep and prolonged REM latency noted.    RECOMMENDATIONS:  1. Advise full-night, attended, CPAP titration study to optimize therapy.     I certify that I have reviewed the entire raw data recording prior to the issuance of this report in accordance with the Standards of Accreditation of the American Academy of Sleep Medicine (AASM)      Melvyn Novas, MD     03-20-2018  Diplomat, American Board of Psychiatry and Neurology  Diplomat, American Board of Sleep Medicine Medical Director, [facility]

## 2018-03-20 NOTE — Telephone Encounter (Signed)
-----   Message from Melvyn Novas, MD sent at 03/20/2018  9:06 AM EDT ----- Not enough apnea to perform a split study, but enough to establish the diagnosis of OSA, mild- moderate ( AHI 17) and REM dependent. John Weber had a very congested nasal passage during the night, which may have brought on snoring louder than usual, especially in supine.  CPAP is recommended treatment, and using saline NS to keep nasal passage open, antihistamine at night.  He can undergo CPAP auto-treatment or attended titration.

## 2018-04-04 ENCOUNTER — Institutional Professional Consult (permissible substitution): Payer: Self-pay | Admitting: Neurology

## 2018-04-04 ENCOUNTER — Encounter

## 2018-04-09 ENCOUNTER — Ambulatory Visit (INDEPENDENT_AMBULATORY_CARE_PROVIDER_SITE_OTHER): Payer: BLUE CROSS/BLUE SHIELD | Admitting: Neurology

## 2018-04-09 DIAGNOSIS — R5383 Other fatigue: Secondary | ICD-10-CM

## 2018-04-09 DIAGNOSIS — G4733 Obstructive sleep apnea (adult) (pediatric): Secondary | ICD-10-CM

## 2018-04-09 DIAGNOSIS — R0683 Snoring: Secondary | ICD-10-CM

## 2018-04-09 DIAGNOSIS — R002 Palpitations: Secondary | ICD-10-CM

## 2018-04-13 ENCOUNTER — Telehealth: Payer: Self-pay | Admitting: Neurology

## 2018-04-13 NOTE — Procedures (Signed)
PATIENT'S NAME:  Kem, Hensen DOB:      [birth year]      MR#:    [id number]     DATE OF RECORDING: 04/09/2018 REFERRING M.D.:  Creola Corn MD Study Performed:   Titration to positive airway pressure.  HISTORY:  Patient returning for a CPAP Titration sleep study scored at 3%, after having a Baseline PSG on 03/14/18. Patient had an AHI of 17.3, REM AHI of 34.3, and an Oxygen nadir of 89%. The patient endorsed the Epworth Sleepiness Scale at 5/24 points.   The patient's weight 230 pounds with a height of 73 (inches), resulting in a BMI of 30.4 kg/m2. The patient's neck circumference measured 18.5 inches.  CURRENT MEDICATIONS: Allegra, Advil, Robaxin, Lopressor  PROCEDURE:  This is a multichannel digital polysomnogram utilizing the SomnoStar 11.2 system.  Electrodes and sensors were applied and monitored per AASM Specifications.   EEG, EOG, Chin and Limb EMG, were sampled at 200 Hz.  ECG, Snore and Nasal Pressure, Thermal Airflow, Respiratory Effort, CPAP Flow and Pressure, Oximetry was sampled at 50 Hz. Digital video and audio were recorded.      CPAP was initiated at 5 cmH20 with heated humidity per AASM split night standards and pressure was advanced to 7 cmH20 because of hypopneas, apneas and desaturations. The patient was fitted with an AirFit P 10 nasal pillow of unknown size. FF-Mask was not tolerated.  At a PAP pressure of 7 cmH20, there was a reduction of the AHI to 0.9/h with Rebounding REM sleep, improvement of snoring and sleep apnea.  Lights Out was at 22:15 and Lights On at 05:22. Total recording time (TRT) was 427.5 minutes, with a total sleep time (TST) of 372 minutes. The patient's sleep latency was 10 minutes. REM latency was 357.5 minutes.  The sleep efficiency was 87.0%.    SLEEP ARCHITECTURE: WASO (Wake after sleep onset) was 45 minutes.  There were 9 minutes in Stage N1, 229.5 minutes Stage N2, 76 minutes Stage N3 and 57.5 minutes in Stage REM.  The percentage of Stage N1 was 2.4%,  Stage N2 was 61.7%, Stage N3 was 20.4% and Stage R (REM sleep) was 15.5%.  RESPIRATORY ANALYSIS:  There was a total of 7 respiratory events: 1 obstructive apnea, 0 central apneas and 6 hypopneas with 0 respiratory event related arousals (RERAs).     The total APNEA/HYPOPNEA INDEX (AHI) was 1.1 /hour .1 event occurred in REM sleep and 6 events in NREM. The REM AHI was 1.0 /hour versus a non-REM AHI of 1.1 /hour.  The patient spent 232 minutes of total sleep time in the supine position and 140 minutes in non-supine. The supine AHI was 1.3/h, versus a non-supine AHI of 0.9.  OXYGEN SATURATION & C02:  The baseline 02 saturation was 95%, with the lowest being 93%. Time spent below 89% saturation equaled 0 minutes.  The arousals were noted as: 85 were spontaneous, 0 were associated with PLMs, and only 1 was associated with respiratory events.  Audio and video analysis did not show any abnormal or unusual movements, behaviors, phonations or vocalizations.   No Nocturia. No Snoring was noted.  EKG was in keeping with normal sinus rhythm (NSR). Post-study, the patient indicated that sleep was the same as usual.   DIAGNOSIS 1. Mild - moderate REM accentuated Obstructive Sleep Apnea responded well to CPAP at 6 and 7 cm water pressure, applied by an AirFit nasal pillow P10. The size was not specified.  2. Normal EKG 3. Very delayed  REM sleep onset.  PLANS/RECOMMENDATIONS: 1. An autotitration CPAP with the above named type of interface will be issued. A setting from 4 through 8 cm water pressure with 1 cm EPR is suggested.  2.  Educate patient in sleep hygiene measures.   3. Achieve and maintain lean body weight. 4. Avoid sedatives, hypnotics, and alcoholic beverage consumption before bedtime.  5. CPAP therapy compliance is defined as 4 hours or more of nightly use.   A follow up appointment [facility].   Please call [phone] with any  questions.      I certify that I have reviewed the entire raw data recording prior to the issuance of this report in accordance with the Standards of Accreditation of the American Academy of Sleep Medicine (AASM)      Melvyn Novasarmen Indira Sorenson, M.D.     04-13-2018  Diplomat, American Board of Psychiatry and Neurology  Diplomat, American Board of Sleep Medicine Medical Director, [facility]

## 2018-04-13 NOTE — Telephone Encounter (Signed)
I called pt. I advised pt that Dr. Vickey Hugerohmeier reviewed their sleep study results and found that pt has sleep apnea and was treated with CPAP. Dr. Vickey Hugerohmeier recommends that pt starts a auto CPAP 4-8 cm water pressure. I reviewed PAP compliance expectations with the pt. Pt is agreeable to starting a CPAP. I advised pt that an order will be sent to a DME, [facility], and [facility] will call the pt within about one week after they file with the pt's insurance. [facility] will show the pt how to use the machine, fit for masks, and troubleshoot the CPAP if needed. A follow up appt was made for insurance purposes with Darrol Angelarolyn Martin, NP Feb 19,2020 at 1:15 pm. Pt verbalized understanding to arrive 15 minutes early and bring their CPAP. A letter with all of this information in it will be mailed to the pt as a reminder. I verified with the pt that the address we have on file is correct. Pt verbalized understanding of results. Pt had no questions at this time but was encouraged to call back if questions arise. I have sent the order to [facility] and have received confirmation that they have received the order.

## 2018-04-13 NOTE — Addendum Note (Signed)
Addended by: Melvyn NovasHMEIER, Azalynn Maxim on: 04/13/2018 01:21 PM   Modules accepted: Orders

## 2018-04-13 NOTE — Telephone Encounter (Signed)
-----   Message from Melvyn Novasarmen Dohmeier, MD sent at 04/13/2018  1:21 PM EST ----- DIAGNOSIS 1. Mild - moderate REM accentuated Obstructive Sleep Apnea  responded well to CPAP at 6 and 7 cm water pressure, applied by  an AirFit nasal pillow P10. The size was not specified.  2. Normal EKG 3. Very delayed REM sleep onset.  PLANS/RECOMMENDATIONS: 1. An autotitration CPAP with a nasal pilllow will be issued. A setting from 4 through 8 cm water pressure with  1 cm EPR is suggested.  2. Educate patient in sleep hygiene measures.  3. Achieve and maintain lean body weight. 4. Avoid sedatives, hypnotics, and alcoholic beverage consumption  before bedtime.  5. CPAP therapy compliance is defined as 4 hours or more of  nightly use.

## 2018-06-02 IMAGING — MR MR ABDOMEN WO/W CM
17 series · 48 of 48 positions shown · IV contrast (multihance)
Comparison: Ultrasound on 02/02/2017

CLINICAL DATA: Indeterminate liver lesion on recent ultrasound.
Elevated liver function tests. Hepatic steatosis.

EXAM:
MRI ABDOMEN WITHOUT AND WITH CONTRAST
TECHNIQUE: Multiplanar multisequence MR imaging of the abdomen was performed
both before and after the administration of intravenous contrast.
CONTRAST:  20mL MULTIHANCE GADOBENATE DIMEGLUMINE 529 MG/ML IV SOLN

[Series 3: T2 · coronal · 5.0mm · 1.41mm/px · 2 of 36 slices shown (1 of 3)]
[im 1/36]
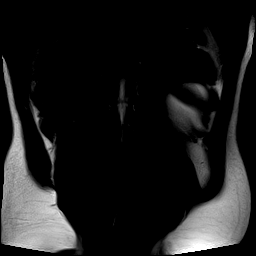
[im 36/36]
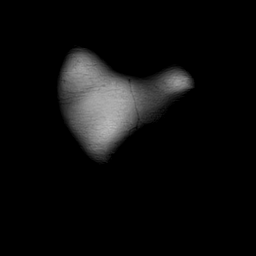

[Series 4: T1 · axial · 3.0mm · 1.19mm/px · z∈[-150,+159]mm · 7 of 208 slices shown]
[im 1/208]
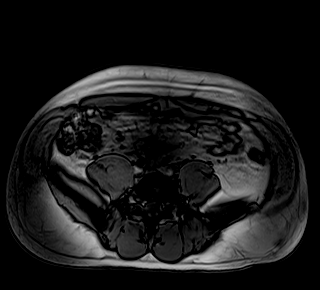
[im 35/208]
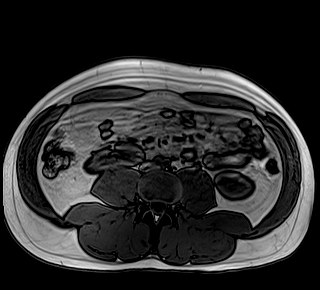
[im 70/208]
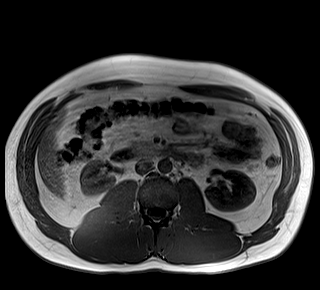
[im 104/208]
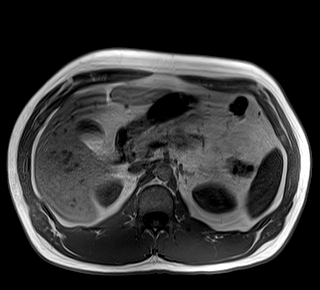
[im 139/208]
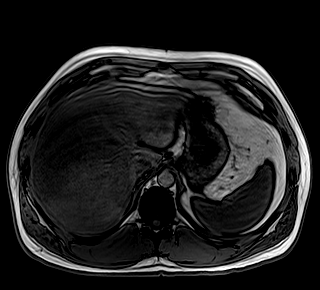
[im 173/208]
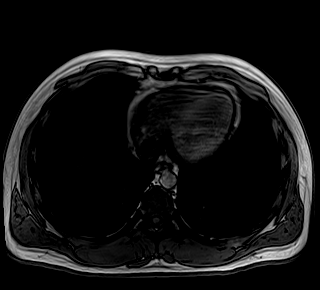
[im 208/208]
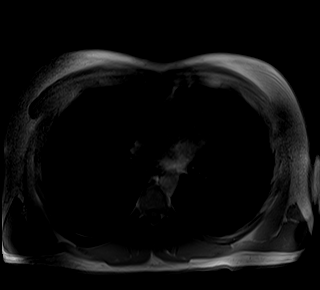

[Series 5: DWI · axial · 5.0mm · 1.42mm/px · z∈[-151,+167]mm · 5 of 162 slices shown (1 of 2)]
[im 1/162]
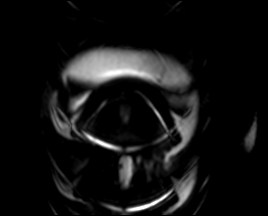
[im 41/162]
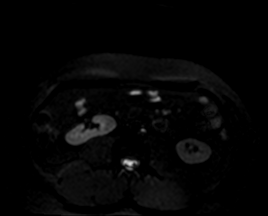
[im 81/162]
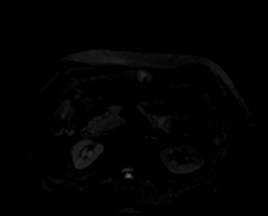
[im 121/162]
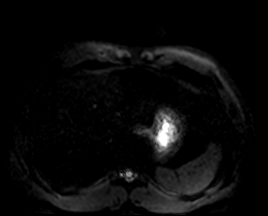
[im 162/162]
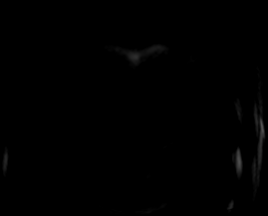

[Series 6: DWI · axial · 5.0mm · 1.42mm/px · z∈[-151,+167]mm · 2 of 54 slices shown (2 of 2)]
[im 1/54]
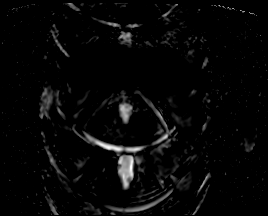
[im 54/54]
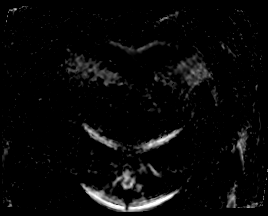

[Series 7: T2 · axial · 6.0mm · 1.19mm/px · 1 of 40 slices shown (2 of 3)]
[im 1/40]
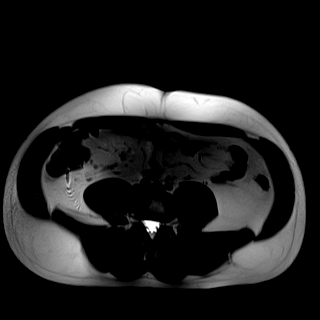

[Series 8: T2 · axial · 5.0mm · 1.48mm/px · 1 of 45 slices shown (3 of 3)]
[im 1/45]
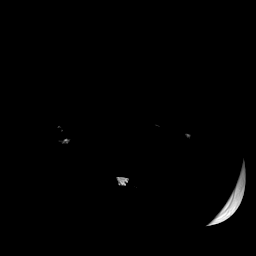

[Series 9: bSSFP · axial · 6.0mm · 1.25mm/px · 1 of 40 slices shown]
[im 1/40]
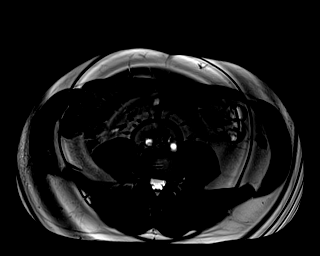

[Series 10: T1 dynamic · axial · non-contrast · 3.0mm · 1.25mm/px · z∈[-119,+142]mm · 3 of 88 slices shown]
[im 1/88]
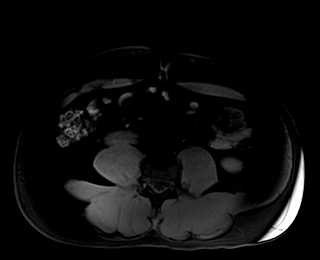
[im 44/88]
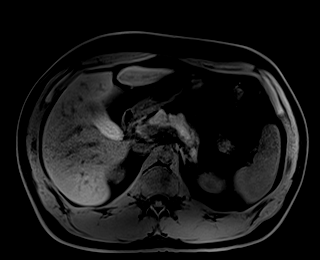
[im 88/88]
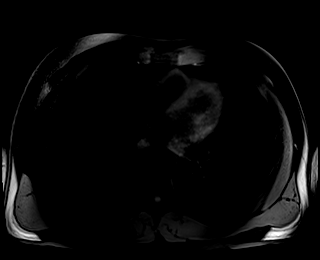

[Series 11: T1 dynamic post-contrast · axial · 3.0mm · 1.25mm/px · z∈[-119,+142]mm · 3 of 88 slices shown (1 of 9)]
[im 1/88]
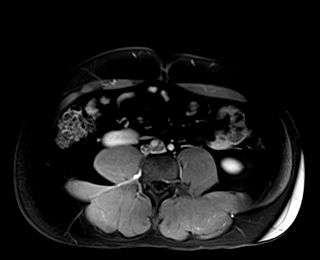
[im 44/88]
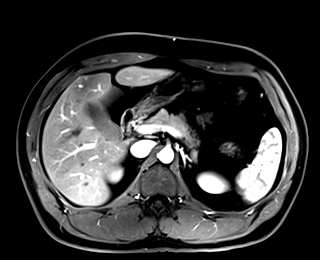
[im 88/88]
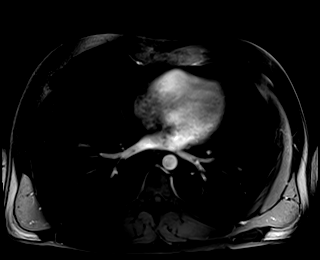

[Series 12: T1 dynamic post-contrast · axial · 3.0mm · 1.25mm/px · z∈[-119,+142]mm · 3 of 88 slices shown (2 of 9)]
[im 1/88]
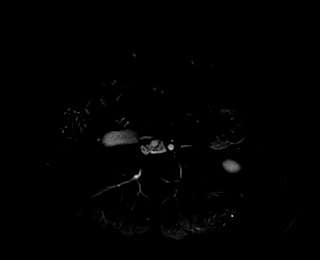
[im 44/88]
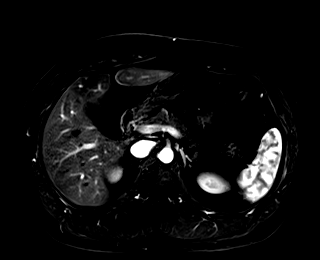
[im 88/88]
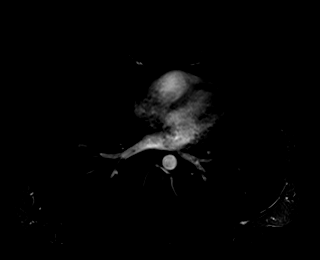

[Series 13: T1 dynamic post-contrast · axial · 3.0mm · 1.25mm/px · z∈[-119,+142]mm · 3 of 88 slices shown (3 of 9)]
[im 1/88]
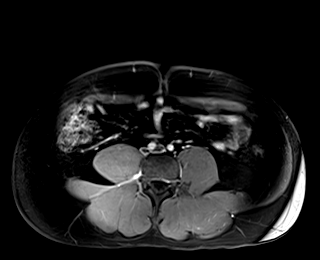
[im 44/88]
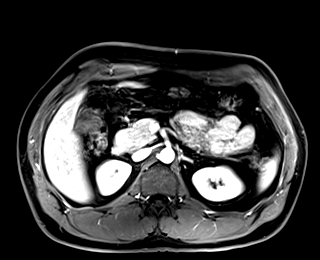
[im 88/88]
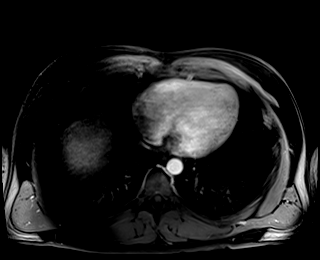

[Series 14: T1 dynamic post-contrast · axial · 3.0mm · 1.25mm/px · z∈[-119,+142]mm · 3 of 88 slices shown (4 of 9)]
[im 1/88]
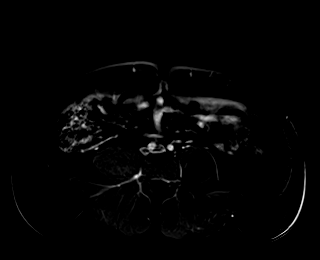
[im 44/88]
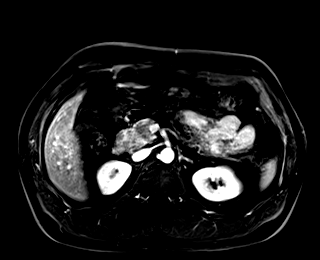
[im 88/88]
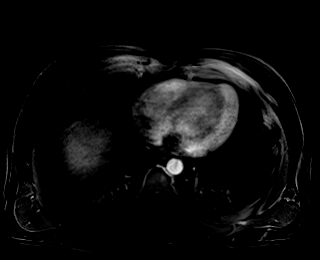

[Series 15: T1 dynamic post-contrast · axial · 3.0mm · 1.25mm/px · z∈[-119,+142]mm · 3 of 88 slices shown (5 of 9)]
[im 1/88]
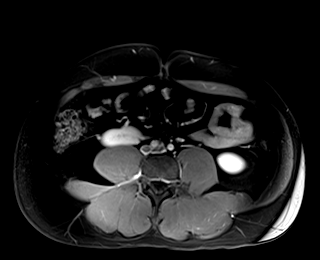
[im 44/88]
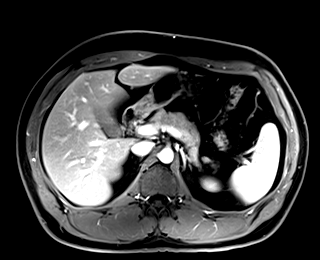
[im 88/88]
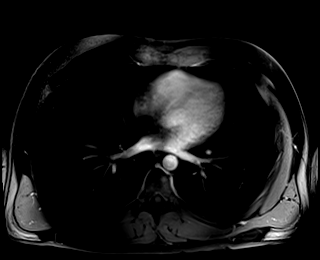

[Series 16: T1 dynamic post-contrast · axial · 3.0mm · 1.25mm/px · z∈[-119,+142]mm · 3 of 88 slices shown (6 of 9)]
[im 1/88]
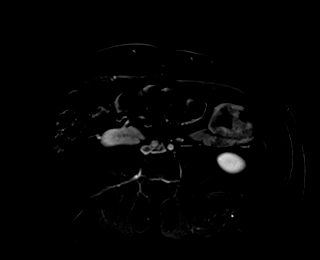
[im 44/88]
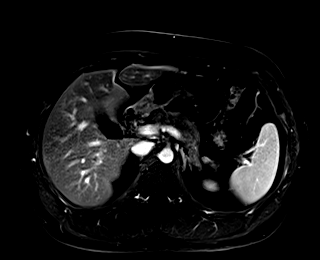
[im 88/88]
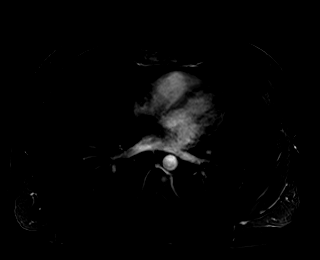

[Series 17: T1 dynamic post-contrast · coronal · 3.0mm · 1.09mm/px · 2 of 72 slices shown (7 of 9)]
[im 1/72]
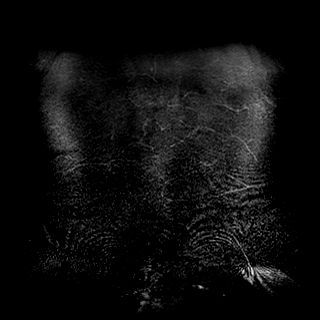
[im 72/72]
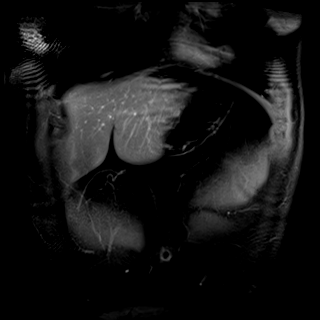

[Series 18: T1 dynamic post-contrast · axial · 3.0mm · 1.25mm/px · z∈[-119,+142]mm · 3 of 88 slices shown (8 of 9)]
[im 1/88]
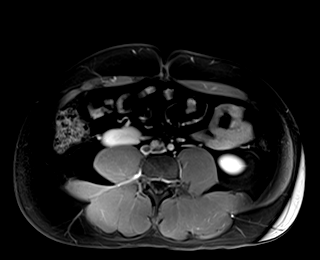
[im 44/88]
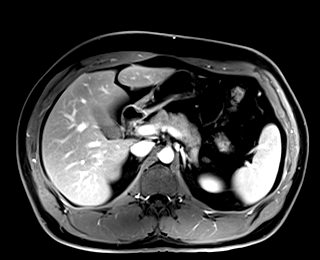
[im 88/88]
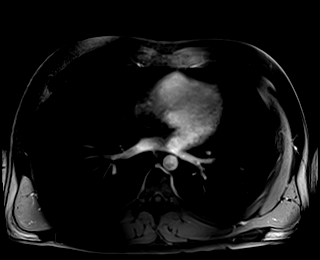

[Series 19: T1 dynamic post-contrast · axial · 3.0mm · 1.25mm/px · z∈[-119,+142]mm · 3 of 88 slices shown (9 of 9)]
[im 1/88]
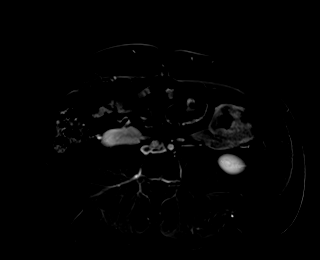
[im 44/88]
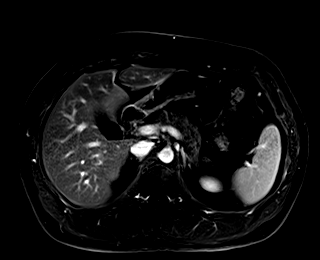
[im 88/88]
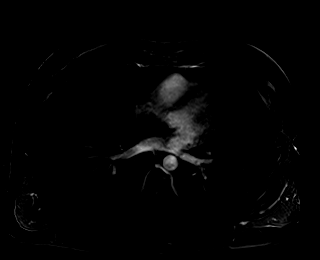

[48 of 48 positions shown; findings below may reference images not displayed]

FINDINGS: Lower chest: No acute findings.

Hepatobiliary: A geographic pattern of hepatic steatosis seen on
chemical shift imaging. There is a 3 cm area of focal fatty sparing
in the central left lobe adjacent to the porta hepatis which
corresponds with the hypoechoic lesion seen on recent ultrasound. No
evidence of hepatic neoplasm.

Gallbladder is unremarkable. No evidence of biliary ductal
dilatation.

Pancreas:  No mass or inflammatory changes.

Spleen:  Within normal limits in size and appearance.

Adrenals/Urinary Tract: Normal adrenal glands. Mild congenital
malrotation of right kidney is noted, however there is no evidence
of renal masses or hydronephrosis.

Stomach/Bowel: Visualized portions within the abdomen are
unremarkable.

Vascular/Lymphatic: No pathologically enlarged lymph nodes
identified. No abdominal aortic aneurysm.

Other:  None.

Musculoskeletal:  No suspicious bone lesions identified.
IMPRESSION: Hepatic steatosis, with benign focal fatty sparing in central liver
corresponding to lesion seen on recent ultrasound.

No evidence of hepatic neoplasm or other significant abnormality.

## 2018-07-10 ENCOUNTER — Other Ambulatory Visit: Payer: Self-pay | Admitting: Otolaryngology

## 2018-07-10 ENCOUNTER — Encounter: Payer: Self-pay | Admitting: Adult Health

## 2018-07-10 DIAGNOSIS — J329 Chronic sinusitis, unspecified: Secondary | ICD-10-CM

## 2018-07-11 NOTE — Progress Notes (Signed)
[facility]  PATIENT: John Weber DOB: [birth year]   REASON FOR VISIT: Follow-up for newly diagnosed obstructive sleep apnea here for initial CPAP  HISTORY FROM: Patient    HISTORY OF PRESENT ILLNESS:UPDATE 2/19/2020CM John Weber, 28 year old male returns for follow-up with newly diagnosed obstructive sleep apnea here for CPAP compliance.  His sleep study on 04/09/2018 shows mild to moderate REM extenuated obstructive sleep apnea responding well to CPAP.  He had normal EKG.  Very delayed REM onset.  He reports today that when he does not have sinus issues he likes using the CPAP.  He has had 2 sinus infections since December.  He is currently seeing ENT.  He is due for a CT of his sinuses next Monday.  There is a strong probability that he will be having surgery.  Compliance data dated 06/11/2018-07/10/2018 shows compliance greater than 4 hours at 43% less than 4 hours at 17% for total usage days 18 out of 30 at 60%.  Average usage 5 hours 49 minutes set pressure 4 to 8 John.  EPR level 3 leak 95th percentile 4.6.  AHI 1.3 ESS 6.  He returns for reevaluation 9/30/19CDColby A Weber is a 28 y.o. male patient of Dr. Ferd Hibbs is seen on 02-20-2018 for a sleep evaluation.   Chief complaint according to patient : " I snore more over the last 2 years and my sleep is not as refreshing". "I probably gained 15 pounds in the timeframe" .  " I transferred to a new, a lot more stressful job ". 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  Bedtime is about 9.30 PM, in a cool, quiet and dark room. He sleeps on his side and prone. He uses 2-3 pillows.  He falls asleep easily, and stays asleep except for one time nocturia. His snoring gets louder when he sleeps on his back- he pauses in his breathing  pattern-but he can always go back to sleep unless its already 5 AM. Sometimes he is air hungry, may have palpitations.   He rises between 5.30- 6 AM with an alarm, he reports he needs the alarm.  He is not refreshed, no headaches, but some nausea is often present. He feels congested. He sleeps 7-8 hours but the quality is not as good.    REVIEW OF SYSTEMS: Full 14 system review of systems performed and notable only for those listed, all others are neg:  Constitutional: neg  Cardiovascular: neg Ear/Nose/Throat: Sinus issues seeing ENT Skin: neg Eyes: neg Respiratory: neg Gastroitestinal: neg  Hematology/Lymphatic: neg  Endocrine: neg Musculoskeletal:neg Allergy/Immunology: neg Neurological: neg Psychiatric: neg Sleep : Obstructive sleep apnea with CPAP   ALLERGIES: Allergies  Allergen Reactions  . Penicillins Anaphylaxis    HOME MEDICATIONS: Outpatient Medications Prior to Visit  Medication Sig Dispense Refill  . metoprolol tartrate (LOPRESSOR) 50 MG tablet Take 1/2 tablet to1 tablet as needed for palpitations. 30 tablet 3  . fexofenadine (ALLEGRA) 60 MG tablet Take by mouth as needed.     Marland Kitchen ibuprofen (ADVIL,MOTRIN) 600 MG tablet Take 1 tablet (600 mg total) by mouth every 6 (six) hours as needed. 30 tablet 0  . methocarbamol (ROBAXIN) 500 MG tablet Take 1 tablet (500 mg total) by mouth 2 (two) times  daily. 20 tablet 0   No facility-administered medications prior to visit.     PAST MEDICAL HISTORY: Past Medical History:  Diagnosis Date  . Syncope     PAST SURGICAL HISTORY: Past Surgical History:  Procedure Laterality Date  . WISDOM TOOTH EXTRACTION      FAMILY HISTORY: Family History  Problem Relation Age of Onset  . Rectal cancer Father     SOCIAL HISTORY: Social History   Socioeconomic History  . Marital status: Married    Spouse name: Not on file  . Number of children: Not on file  . Years of education: Not on file  . Highest education level: Not on  file  Occupational History  . Not on file  Social Needs  . Financial resource strain: Not on file  . Food insecurity:    Worry: Not on file    Inability: Not on file  . Transportation needs:    Medical: Not on file    Non-medical: Not on file  Tobacco Use  . Smoking status: Never Smoker  . Smokeless tobacco: Never Used  Substance and Sexual Activity  . Alcohol use: No  . Drug use: Not on file  . Sexual activity: Not on file  Lifestyle  . Physical activity:    Days per week: Not on file    Minutes per session: Not on file  . Stress: Not on file  Relationships  . Social connections:    Talks on phone: Not on file    Gets together: Not on file    Attends religious service: Not on file    Active member of club or organization: Not on file    Attends meetings of clubs or organizations: Not on file    Relationship status: Not on file  . Intimate partner violence:    Fear of current or ex partner: Not on file    Emotionally abused: Not on file    Physically abused: Not on file    Forced sexual activity: Not on file  Other Topics Concern  . Not on file  Social History Narrative  . Not on file     PHYSICAL EXAM  Vitals:   07/12/18 1312  BP: 131/77  Pulse: 74  Weight: 226 lb (102.5 kg)  Height: 6\' 1"  (1.854 m)   Body mass index is 29.82 kg/m.  Generalized: Well developed, in no acute distress  Head: normocephalic and atraumatic,. Oropharynx benign mallopatti3 Neck: Supple, circumference 18.5 Lungs: Clear  Musculoskeletal: No deformity  Skin no rash or edema Neurological examination   Mentation: Alert oriented to time, place, history taking. Attention span and concentration appropriate. Recent and remote memory intact.  Follows all commands speech and language fluent.   Cranial nerve II-XII: Pupils were equal round reactive to light extraocular movements were full, visual field were full on confrontational test. Facial sensation and strength were normal. hearing  was intact to finger rubbing bilaterally. Uvula tongue midline. head turning and shoulder shrug were normal and symmetric.Tongue protrusion into cheek strength was normal. Motor: normal bulk and tone, full strength in the BUE, BLE,  Sensory: normal and symmetric to light touch, Coordination: finger-nose-finger, heel-to-shin bilaterally, no dysmetria Gait and Station: Rising up from seated position without assistance, normal stance,  moderate stride, good arm swing, smooth turning, able to perform tiptoe, and heel walking without difficulty. Tandem gait is steady  DIAGNOSTIC DATA (LABS, IMAGING, TESTING) - I reviewed patient records, labs, notes, testing and imaging myself where available.  Lab Results  Component Value Date   WBC 9.9 10/20/2016   HGB 15.2 10/20/2016   HCT 44.5 10/20/2016   MCV 85.2 10/20/2016   PLT 238 10/20/2016      Component Value Date/Time   NA 136 10/20/2016 1908   K 3.7 10/20/2016 1908   CL 102 10/20/2016 1908   CO2 27 10/20/2016 1908   GLUCOSE 109 (H) 10/20/2016 1908   BUN 10 10/20/2016 1908   CREATININE 1.17 10/20/2016 1908   CALCIUM 9.1 10/20/2016 1908   PROT 6.9 10/20/2016 1908   ALBUMIN 4.4 10/20/2016 1908   AST 54 (H) 10/20/2016 1908   ALT 100 (H) 10/20/2016 1908   ALKPHOS 52 10/20/2016 1908   BILITOT 0.8 10/20/2016 1908   GFRNONAA >60 10/20/2016 1908   GFRAA >60 10/20/2016 1908    ASSESSMENT AND PLAN  28 y.o. year old male  has a past medical history of Syncope. here to follow-up for newly diagnosed obstructive sleep apnea here for CPAP compliance. His sleep study on 04/09/2018 shows mild to moderate REM extenuated obstructive sleep apnea responding well to CPAP.  He had normal EKG.  Very delayed REM onset.  He reports today that when he does not have sinus issues he likes using the CPAP.  He has had 2 sinus infections since December.  He is currently seeing ENT.  He is due for a CT of his sinuses next Monday.  There is a strong probability that he  will be having surgery.  Compliance data dated 06/11/2018-07/10/2018 shows compliance greater than 4 hours at 43% less than 4 hours at 17% for total usage days 18 out of 30 at 60%.  Average usage 5 hours 49 minutes set pressure 4 to 8 John.  EPR level 3 leak 95th percentile 4.6.  AHI 1.3 ESS 6.   PLAN:  CPAP compliance 43% greater than 4 hours Compliance needs to be greater than 70% per insurance guidelines Continue same settings Patient is currently being followed by ENT for sinus issues and possible surgery Follow-up in 4 months for repeat compliance Nilda RiggsNancy Carolyn Martin, [facility] CenterGNP, [facility] CenterBC, APRN  [facility] [address], [address] [place], [place] [address] [phone]

## 2018-07-12 ENCOUNTER — Encounter: Payer: Self-pay | Admitting: Nurse Practitioner

## 2018-07-12 ENCOUNTER — Ambulatory Visit: Payer: BLUE CROSS/BLUE SHIELD | Admitting: Nurse Practitioner

## 2018-07-12 DIAGNOSIS — G4733 Obstructive sleep apnea (adult) (pediatric): Secondary | ICD-10-CM | POA: Diagnosis not present

## 2018-07-12 DIAGNOSIS — Z9989 Dependence on other enabling machines and devices: Secondary | ICD-10-CM | POA: Diagnosis not present

## 2018-07-12 NOTE — Patient Instructions (Signed)
CPAP compliance 43% greater than 4 hours Continue same settings Patient is currently being followed by ENT for sinus issues and possible surgery Follow-up in 4 months for repeat compliance

## 2018-07-17 ENCOUNTER — Ambulatory Visit
Admission: RE | Admit: 2018-07-17 | Discharge: 2018-07-17 | Disposition: A | Discharge status: Home / Self Care | Payer: BLUE CROSS/BLUE SHIELD | Source: Ambulatory Visit | Attending: Otolaryngology | Admitting: Otolaryngology

## 2018-07-17 DIAGNOSIS — J329 Chronic sinusitis, unspecified: Secondary | ICD-10-CM

## 2018-08-18 ENCOUNTER — Other Ambulatory Visit: Payer: Self-pay | Admitting: Otolaryngology

## 2018-11-20 ENCOUNTER — Telehealth (INDEPENDENT_AMBULATORY_CARE_PROVIDER_SITE_OTHER): Payer: BC Managed Care – PPO | Admitting: Adult Health

## 2018-11-20 DIAGNOSIS — Z9989 Dependence on other enabling machines and devices: Secondary | ICD-10-CM | POA: Diagnosis not present

## 2018-11-20 DIAGNOSIS — G4733 Obstructive sleep apnea (adult) (pediatric): Secondary | ICD-10-CM | POA: Diagnosis not present

## 2018-11-20 NOTE — Progress Notes (Signed)
PATIENT: John Weber DOB: 04-Sep-1990  REASON FOR VISIT: follow up HISTORY FROM: patient  Virtual Visit via Video Note  I connected with John Weber on 11/20/18 at  3:00 PM EDT by a video enabled telemedicine application located remotely at [facility] and verified that I am speaking with the correct person using two identifiers who was located at their own home.   I discussed the limitations of evaluation and management by telemedicine and the availability of in person appointments. The patient expressed understanding and agreed to proceed.   PATIENT: John Weber DOB: 01-04-1991  REASON FOR VISIT: follow up HISTORY FROM: patient  HISTORY OF PRESENT ILLNESS: Today 11/20/18 John Weber is a 28 year old male with a history of obstructive sleep apnea on CPAP.  He returns today for a virtual visit.  His download indicates that he has not used his machine very much in the last month.  He states that he has had chronic sinus infection and due to congestion he has been unable to use the machine.  He states that he has surgery scheduled for August 3 for his sinuses.  He states when he was using machine consistently he noticed the benefit.  He states that since has not been using it he can tell that it is affecting his sleep.  He has tried using a fullface mask but due to claustrophobia he has a hard time keeping it on.    REVIEW OF SYSTEMS: Out of a complete 14 system review of symptoms, the patient complains only of the following symptoms, and all other reviewed systems are negative.  ALLERGIES: Allergies  Allergen Reactions   Penicillins Anaphylaxis    HOME MEDICATIONS: Outpatient Medications Prior to Visit  Medication Sig Dispense Refill   metoprolol tartrate (LOPRESSOR) 50 MG tablet Take 1/2 tablet to1 tablet as needed for palpitations. 30 tablet 3   No facility-administered medications prior to visit.     PAST MEDICAL HISTORY: Past Medical History:   Diagnosis Date   Syncope     PAST SURGICAL HISTORY: Past Surgical History:  Procedure Laterality Date   WISDOM TOOTH EXTRACTION      FAMILY HISTORY: Family History  Problem Relation Age of Onset   Rectal cancer Father     SOCIAL HISTORY: Social History   Socioeconomic History   Marital status: Married    Spouse name: Not on file   Number of children: Not on file   Years of education: Not on file   Highest education level: Not on file  Occupational History   Not on file  Social Needs   Financial resource strain: Not on file   Food insecurity    Worry: Not on file    Inability: Not on file   Transportation needs    Medical: Not on file    Non-medical: Not on file  Tobacco Use   Smoking status: Never Smoker   Smokeless tobacco: Never Used  Substance and Sexual Activity   Alcohol use: No   Drug use: Not on file   Sexual activity: Not on file  Lifestyle   Physical activity    Days per week: Not on file    Minutes per session: Not on file   Stress: Not on file  Relationships   Social connections    Talks on phone: Not on file    Gets together: Not on file    Attends religious service: Not on file    Active member of club  or organization: Not on file    Attends meetings of clubs or organizations: Not on file    Relationship status: Not on file   Intimate partner violence    Fear of current or ex partner: Not on file    Emotionally abused: Not on file    Physically abused: Not on file    Forced sexual activity: Not on file  Other Topics Concern   Not on file  Social History Narrative   Not on file      PHYSICAL EXAM Generalized: Well developed, in no acute distress   Neurological examination  Mentation: Alert oriented to time, place, history taking. Follows all commands speech and language fluent Cranial nerve II-XII:Extraocular movements were full. Facial symmetry noted. uvula tongue midline. Head turning and shoulder shrug   were normal and symmetric. Motor: Good strength throughout subjectively per patient Sensory: Sensory testing is intact to soft touch on all 4 extremities subjectively per patient Coordination: Cerebellar testing reveals good finger-nose-finger  Gait and station: Patient is able to stand from a seated position. gait is normal.  Reflexes: UTA  DIAGNOSTIC DATA (LABS, IMAGING, TESTING) - I reviewed patient records, labs, notes, testing and imaging myself where available.  Lab Results  Component Value Date   WBC 9.9 10/20/2016   HGB 15.2 10/20/2016   HCT 44.5 10/20/2016   MCV 85.2 10/20/2016   PLT 238 10/20/2016      Component Value Date/Time   NA 136 10/20/2016 1908   K 3.7 10/20/2016 1908   CL 102 10/20/2016 1908   CO2 27 10/20/2016 1908   GLUCOSE 109 (H) 10/20/2016 1908   BUN 10 10/20/2016 1908   CREATININE 1.17 10/20/2016 1908   CALCIUM 9.1 10/20/2016 1908   PROT 6.9 10/20/2016 1908   ALBUMIN 4.4 10/20/2016 1908   AST 54 (H) 10/20/2016 1908   ALT 100 (H) 10/20/2016 1908   ALKPHOS 52 10/20/2016 1908   BILITOT 0.8 10/20/2016 1908   GFRNONAA >60 10/20/2016 1908   GFRAA >60 10/20/2016 1908      ASSESSMENT AND PLAN 28 y.o. year old male  has a past medical history of Syncope. here with:  1.  Obstructive sleep apnea on CPAP  The patient CPAP download shows that he is not been using the machine however he reports this is due to chronic sinus issues.  I have encouraged the patient to try a different style mask if he can tolerate it for the next month until he is able to have surgery.  Patient voiced understanding.  He will follow-up in 6 months or sooner if needed.  I spent 15 minutes with the patient this time was spent reviewing his CPAP download and discussing risk associated with untreated sleep apnea.   Butch PennyMegan Shivaay Stormont, MSN, NP-C 11/20/2018, 3:00 PM [facility] [address], [address] [place], [place] [address] [phone]

## 2018-12-18 ENCOUNTER — Other Ambulatory Visit: Payer: Self-pay

## 2018-12-18 ENCOUNTER — Encounter (HOSPITAL_BASED_OUTPATIENT_CLINIC_OR_DEPARTMENT_OTHER): Payer: Self-pay | Admitting: *Deleted

## 2018-12-21 ENCOUNTER — Other Ambulatory Visit (HOSPITAL_COMMUNITY)
Admission: RE | Admit: 2018-12-21 | Discharge: 2018-12-21 | Disposition: A | Discharge status: Home / Self Care | Payer: BC Managed Care – PPO | Source: Ambulatory Visit | Attending: Otolaryngology | Admitting: Otolaryngology

## 2018-12-21 DIAGNOSIS — Z20828 Contact with and (suspected) exposure to other viral communicable diseases: Secondary | ICD-10-CM | POA: Insufficient documentation

## 2018-12-21 LAB — SARS CORONAVIRUS 2 (TAT 6-24 HRS): SARS Coronavirus 2: NEGATIVE

## 2018-12-25 ENCOUNTER — Ambulatory Visit (HOSPITAL_BASED_OUTPATIENT_CLINIC_OR_DEPARTMENT_OTHER): Payer: BC Managed Care – PPO | Admitting: Anesthesiology

## 2018-12-25 ENCOUNTER — Ambulatory Visit (HOSPITAL_BASED_OUTPATIENT_CLINIC_OR_DEPARTMENT_OTHER)
Admission: RE | Admit: 2018-12-25 | Discharge: 2018-12-25 | Disposition: A | Discharge status: Home / Self Care | Payer: BC Managed Care – PPO | Attending: Otolaryngology | Admitting: Otolaryngology

## 2018-12-25 ENCOUNTER — Encounter (HOSPITAL_BASED_OUTPATIENT_CLINIC_OR_DEPARTMENT_OTHER): Payer: Self-pay

## 2018-12-25 ENCOUNTER — Other Ambulatory Visit: Payer: Self-pay

## 2018-12-25 ENCOUNTER — Encounter (HOSPITAL_BASED_OUTPATIENT_CLINIC_OR_DEPARTMENT_OTHER)
Admission: RE | Disposition: A | Discharge status: Home / Self Care | Payer: Self-pay | Source: Home / Self Care | Attending: Otolaryngology

## 2018-12-25 DIAGNOSIS — J3489 Other specified disorders of nose and nasal sinuses: Secondary | ICD-10-CM | POA: Insufficient documentation

## 2018-12-25 DIAGNOSIS — G4730 Sleep apnea, unspecified: Secondary | ICD-10-CM | POA: Insufficient documentation

## 2018-12-25 DIAGNOSIS — J342 Deviated nasal septum: Secondary | ICD-10-CM | POA: Diagnosis not present

## 2018-12-25 DIAGNOSIS — J343 Hypertrophy of nasal turbinates: Secondary | ICD-10-CM | POA: Insufficient documentation

## 2018-12-25 HISTORY — PX: NASAL SEPTOPLASTY W/ TURBINOPLASTY: SHX2070

## 2018-12-25 HISTORY — DX: Sleep apnea, unspecified: G47.30

## 2018-12-25 SURGERY — SEPTOPLASTY, NOSE, WITH NASAL TURBINATE REDUCTION
Anesthesia: General | Site: Nose | Laterality: Bilateral

## 2018-12-25 MED ORDER — FENTANYL CITRATE (PF) 100 MCG/2ML IJ SOLN
25.0000 ug | INTRAMUSCULAR | Status: DC | PRN
  Administered 2018-12-25: 50 ug via INTRAVENOUS
  Administered 2018-12-25 (×2): 25 ug via INTRAVENOUS

## 2018-12-25 MED ORDER — OXYCODONE HCL 5 MG PO TABS
ORAL_TABLET | ORAL | Status: AC
  Filled 2018-12-25: qty 1

## 2018-12-25 MED ORDER — ROCURONIUM BROMIDE 10 MG/ML (PF) SYRINGE
PREFILLED_SYRINGE | INTRAVENOUS | Status: AC
  Filled 2018-12-25: qty 10

## 2018-12-25 MED ORDER — CLINDAMYCIN HCL 300 MG PO CAPS: 300.0000 mg | ORAL_CAPSULE | Freq: Three times a day (TID) | ORAL | 0 refills | Status: AC

## 2018-12-25 MED ORDER — ACETAMINOPHEN 500 MG PO TABS
1000.0000 mg | ORAL_TABLET | Freq: Once | ORAL | Status: AC
  Administered 2018-12-25: 1000 mg via ORAL

## 2018-12-25 MED ORDER — LIDOCAINE HCL (CARDIAC) PF 100 MG/5ML IV SOSY
PREFILLED_SYRINGE | INTRAVENOUS | Status: DC | PRN
  Administered 2018-12-25: 100 mg via INTRAVENOUS

## 2018-12-25 MED ORDER — OXYMETAZOLINE HCL 0.05 % NA SOLN
NASAL | Status: DC | PRN
  Administered 2018-12-25: 1 via TOPICAL

## 2018-12-25 MED ORDER — MIDAZOLAM HCL 2 MG/2ML IJ SOLN
INTRAMUSCULAR | Status: AC
  Filled 2018-12-25: qty 2

## 2018-12-25 MED ORDER — LIDOCAINE-EPINEPHRINE 1 %-1:100000 IJ SOLN
INTRAMUSCULAR | Status: DC | PRN
  Administered 2018-12-25: 2 mL

## 2018-12-25 MED ORDER — ONDANSETRON HCL 4 MG/2ML IJ SOLN
INTRAMUSCULAR | Status: DC | PRN
  Administered 2018-12-25: 4 mg via INTRAVENOUS

## 2018-12-25 MED ORDER — MUPIROCIN 2 % EX OINT
TOPICAL_OINTMENT | CUTANEOUS | Status: DC | PRN
  Administered 2018-12-25: 1 via NASAL

## 2018-12-25 MED ORDER — FENTANYL CITRATE (PF) 100 MCG/2ML IJ SOLN
INTRAMUSCULAR | Status: AC
  Filled 2018-12-25: qty 2

## 2018-12-25 MED ORDER — ACETAMINOPHEN 500 MG PO TABS
ORAL_TABLET | ORAL | Status: AC
  Filled 2018-12-25: qty 2

## 2018-12-25 MED ORDER — SUGAMMADEX SODIUM 500 MG/5ML IV SOLN
INTRAVENOUS | Status: DC | PRN
  Administered 2018-12-25: 400 mg via INTRAVENOUS

## 2018-12-25 MED ORDER — CLINDAMYCIN PHOSPHATE 900 MG/50ML IV SOLN
INTRAVENOUS | Status: DC | PRN
  Administered 2018-12-25: 900 mg via INTRAVENOUS

## 2018-12-25 MED ORDER — ROCURONIUM BROMIDE 100 MG/10ML IV SOLN
INTRAVENOUS | Status: DC | PRN
  Administered 2018-12-25: 60 mg via INTRAVENOUS

## 2018-12-25 MED ORDER — DEXAMETHASONE SODIUM PHOSPHATE 4 MG/ML IJ SOLN
INTRAMUSCULAR | Status: DC | PRN
  Administered 2018-12-25: 10 mg via INTRAVENOUS

## 2018-12-25 MED ORDER — SCOPOLAMINE 1 MG/3DAYS TD PT72: 1.0000 | MEDICATED_PATCH | Freq: Once | TRANSDERMAL | Status: DC

## 2018-12-25 MED ORDER — LACTATED RINGERS IV SOLN
INTRAVENOUS | Status: DC
  Administered 2018-12-25 (×2): via INTRAVENOUS

## 2018-12-25 MED ORDER — MIDAZOLAM HCL 2 MG/2ML IJ SOLN
1.0000 mg | INTRAMUSCULAR | Status: DC | PRN
  Administered 2018-12-25: 2 mg via INTRAVENOUS

## 2018-12-25 MED ORDER — PROPOFOL 10 MG/ML IV BOLUS
INTRAVENOUS | Status: DC | PRN
  Administered 2018-12-25: 200 mg via INTRAVENOUS

## 2018-12-25 MED ORDER — OXYCODONE HCL 5 MG PO TABS
5.0000 mg | ORAL_TABLET | Freq: Once | ORAL | Status: AC
  Administered 2018-12-25: 12:00:00 5 mg via ORAL

## 2018-12-25 MED ORDER — PROPOFOL 10 MG/ML IV BOLUS
INTRAVENOUS | Status: AC
  Filled 2018-12-25: qty 20

## 2018-12-25 MED ORDER — OXYCODONE-ACETAMINOPHEN 5-325 MG PO TABS: 1.0000 | ORAL_TABLET | ORAL | 0 refills | Status: AC | PRN

## 2018-12-25 MED ORDER — LIDOCAINE 2% (20 MG/ML) 5 ML SYRINGE
INTRAMUSCULAR | Status: AC
  Filled 2018-12-25: qty 5

## 2018-12-25 MED ORDER — CLINDAMYCIN PHOSPHATE 900 MG/50ML IV SOLN
INTRAVENOUS | Status: AC
  Filled 2018-12-25: qty 50

## 2018-12-25 MED ORDER — FENTANYL CITRATE (PF) 100 MCG/2ML IJ SOLN
50.0000 ug | INTRAMUSCULAR | Status: DC | PRN
  Administered 2018-12-25: 100 ug via INTRAVENOUS

## 2018-12-25 SURGICAL SUPPLY — 39 items
ATTRACTOMAT 16X20 MAGNETIC DRP (DRAPES) IMPLANT
CANISTER SUCT 1200ML W/VALVE (MISCELLANEOUS) ×3 IMPLANT
COAGULATOR SUCT 8FR VV (MISCELLANEOUS) ×3 IMPLANT
COVER WAND RF STERILE (DRAPES) IMPLANT
DECANTER SPIKE VIAL GLASS SM (MISCELLANEOUS) ×2 IMPLANT
DRSG NASOPORE 8CM (GAUZE/BANDAGES/DRESSINGS) IMPLANT
DRSG TELFA 3X8 NADH (GAUZE/BANDAGES/DRESSINGS) IMPLANT
ELECT REM PT RETURN 9FT ADLT (ELECTROSURGICAL) ×3
ELECTRODE REM PT RTRN 9FT ADLT (ELECTROSURGICAL) ×1 IMPLANT
GLOVE BIO SURGEON STRL SZ 6.5 (GLOVE) ×2 IMPLANT
GLOVE BIO SURGEON STRL SZ7.5 (GLOVE) ×3 IMPLANT
GLOVE BIO SURGEONS STRL SZ 6.5 (GLOVE) ×2
GLOVE BIOGEL PI IND STRL 6.5 (GLOVE) IMPLANT
GLOVE BIOGEL PI INDICATOR 6.5 (GLOVE) ×4
GOWN STRL REUS W/ TWL LRG LVL3 (GOWN DISPOSABLE) ×2 IMPLANT
GOWN STRL REUS W/ TWL XL LVL3 (GOWN DISPOSABLE) IMPLANT
GOWN STRL REUS W/TWL LRG LVL3 (GOWN DISPOSABLE) ×3
GOWN STRL REUS W/TWL XL LVL3 (GOWN DISPOSABLE) ×3
NDL HYPO 25X1 1.5 SAFETY (NEEDLE) ×1 IMPLANT
NEEDLE HYPO 25X1 1.5 SAFETY (NEEDLE) ×3 IMPLANT
NS IRRIG 1000ML POUR BTL (IV SOLUTION) ×3 IMPLANT
PACK BASIN DAY SURGERY FS (CUSTOM PROCEDURE TRAY) ×3 IMPLANT
PACK ENT DAY SURGERY (CUSTOM PROCEDURE TRAY) ×3 IMPLANT
PAD DRESSING TELFA 3X8 NADH (GAUZE/BANDAGES/DRESSINGS) IMPLANT
SLEEVE SCD COMPRESS KNEE MED (MISCELLANEOUS) ×2 IMPLANT
SOLUTION BUTLER CLEAR DIP (MISCELLANEOUS) ×3 IMPLANT
SPLINT NASAL AIRWAY SILICONE (MISCELLANEOUS) ×2 IMPLANT
SPONGE GAUZE 2X2 8PLY STER LF (GAUZE/BANDAGES/DRESSINGS) ×1
SPONGE GAUZE 2X2 8PLY STRL LF (GAUZE/BANDAGES/DRESSINGS) ×2 IMPLANT
SPONGE NEURO XRAY DETECT 1X3 (DISPOSABLE) ×3 IMPLANT
SUT CHROMIC 4 0 P 3 18 (SUTURE) ×3 IMPLANT
SUT PLAIN 4 0 ~~LOC~~ 1 (SUTURE) ×3 IMPLANT
SUT PROLENE 3 0 PS 2 (SUTURE) ×2 IMPLANT
SUT VIC AB 4-0 P-3 18XBRD (SUTURE) IMPLANT
SUT VIC AB 4-0 P3 18 (SUTURE)
TOWEL GREEN STERILE FF (TOWEL DISPOSABLE) ×3 IMPLANT
TUBE SALEM SUMP 12R W/ARV (TUBING) IMPLANT
TUBE SALEM SUMP 16 FR W/ARV (TUBING) ×3 IMPLANT
YANKAUER SUCT BULB TIP NO VENT (SUCTIONS) ×3 IMPLANT

## 2018-12-25 NOTE — H&P (Signed)
Cc: Chronic nasal obstruction  HPI: The patient is a 28 year old male who returns today for his follow-up evaluation. The patient was last seen 1 month ago.  At that time, he was complaining of chronic nasal obstruction and recurrent sinusitis.  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  No polyps, mass, or lesion was noted.  He subsequently underwent a sinus CT scan.  The CT showed no significant evidence of sinusitis. However, he does have significant nasal septal deviation and bilateral inferior turbinate hypertrophy, causing severe obstruction of his nasal passageways.  The patient returns today reporting no improvement in his obstruction.  He has been using Flonase nasal spray daily.  He also uses over-the-counter allergy medications.  Currently he denies any facial pain, fever, or purulent drainage.   Exam: The nasal cavities were decongested and anesthetised with a combination of oxymetazoline and 4% lidocaine solution.  The flexible scope was inserted into the right nasal cavity.  Endoscopy of the inferior and middle meatus was performed.  Edematous mucosa was noted.  NSD. No polyp, mass, or lesion was appreciated.  Olfactory cleft was clear.  Nasopharynx was clear.  Turbinates were hypertrophied but without mass.  Incomplete response to decongestion.  The procedure was repeated on the contralateral side with similar findings.  The patient tolerated the procedure well.  Instructions were given to avoid eating or drinking for 2 hours.   Assessment: 1.  Severe nasal mucosal congestion, nasal septal deviation, and bilateral inferior turbinate hypertrophy.  More than 95% of his nasal passageways are obstructed bilaterally.  2.  There is no evidence of purulent drainage, polyps, mass, or lesion noted on today's nasal endoscopy examination. The patient's CT scan showed no evidence of significant acute or chronic sinusitis.   Plan: 1.  The  nasal endoscopy findings are reviewed with the patient.  2.  The CT images are also reviewed with the patient.  3.  In light of his persistent symptoms, he may benefit from undergoing surgical intervention with septoplasty and turbinate reduction.  The risks, benefits, alternatives and details of the procedures are discussed.  4.  The patient would like to proceed with the procedures.

## 2018-12-25 NOTE — Anesthesia Procedure Notes (Signed)
Procedure Name: Intubation Performed by: Verita Lamb, CRNA Pre-anesthesia Checklist: Emergency Drugs available, Suction available, Patient identified, Patient being monitored and Timeout performed Patient Re-evaluated:Patient Re-evaluated prior to induction Oxygen Delivery Method: Circle system utilized Preoxygenation: Pre-oxygenation with 100% oxygen Induction Type: IV induction Ventilation: Mask ventilation without difficulty Laryngoscope Size: Mac and 3 Grade View: Grade II Tube type: Oral Tube size: 8.0 mm Number of attempts: 1 Airway Equipment and Method: Stylet Placement Confirmation: ETT inserted through vocal cords under direct vision,  positive ETCO2,  breath sounds checked- equal and bilateral and CO2 detector Secured at: 23 cm Tube secured with: Tape Dental Injury: Teeth and Oropharynx as per pre-operative assessment

## 2018-12-25 NOTE — Op Note (Signed)
DATE OF PROCEDURE: 12/25/2018  OPERATIVE REPORT   SURGEON: Leta Baptist, MD   PREOPERATIVE DIAGNOSES:  1. Severe nasal septal deviation.  2. Bilateral inferior turbinate hypertrophy.  3. Chronic nasal obstruction.  POSTOPERATIVE DIAGNOSES:  1. Severe nasal septal deviation.  2. Bilateral inferior turbinate hypertrophy.  3. Chronic nasal obstruction.  PROCEDURE PERFORMED:  1. Septoplasty.  2. Bilateral partial inferior turbinate resection.   ANESTHESIA: General endotracheal tube anesthesia.   COMPLICATIONS: None.   ESTIMATED BLOOD LOSS: 50 mL.   INDICATION FOR PROCEDURE: John Weber is a 28 y.o. male with a history of chronic nasal obstruction. The patient was  treated with antihistamine, decongestant, and steroid nasal spray. However, the patient continued to be symptomatic. On examination, the patient was noted to have bilateral severe inferior turbinate hypertrophy and significant nasal septal deviation, causing significant nasal obstruction. Based on the above findings, the decision was made for the patient to undergo the above-stated procedures. The risks, benefits, alternatives, and details of the procedures were discussed with the patient. Questions were invited and answered. Informed consent was obtained.   DESCRIPTION OF PROCEDURE: The patient was taken to the operating room and placed supine on the operating table. General endotracheal tube anesthesia was administered by the anesthesiologist. The patient was positioned, and prepped and draped in the standard fashion for nasal surgery. Pledgets soaked with Afrin were placed in both nasal cavities for decongestion. The pledgets were subsequently removed.  Examination of the nasal cavity revealed a severe nasal septal deviation. 1% lidocaine with 1:100,000 epinephrine was injected onto the nasal septum bilaterally. A hemitransfixion incision was made on the left side. The mucosal flap was carefully elevated on the left side. A  cartilaginous incision was made 1 cm superior to the caudal margin of the nasal septum. Mucosal flap was also elevated on the right side in the similar fashion. It should be noted that due to the severe septal deviation, the deviated portion of the cartilaginous and bony septum had to be removed in piecemeal fashion. Once the deviated portions were removed, a straight midline septum was achieved. The septum was then quilted with 4-0 plain gut sutures. The hemitransfixion incision was closed with interrupted 4-0 chromic sutures.   The inferior one half of both hypertrophied inferior turbinate was crossclamped with a Kelly clamp. The inferior one half of each inferior turbinate was then resected with a pair of cross cutting scissors. Hemostasis was achieved with a suction cautery device. Doyle splints were applied to the nasal septum.  The care of the patient was turned over to the anesthesiologist. The patient was awakened from anesthesia without difficulty. The patient was extubated and transferred to the recovery room in good condition.   OPERATIVE FINDINGS: Severe nasal septal deviation and bilateral inferior turbinate hypertrophy.   SPECIMEN: None.   FOLLOWUP CARE: The patient be discharged home once he is awake and alert. The patient will be placed on Percocet 1 tablets p.o. q.4 hours p.r.n. pain, and clindamycin 300mg  TID for 3 days. The patient will follow up in my office in 2 days for splint removal.   Darlisha Kelm Raynelle Bring, MD

## 2018-12-25 NOTE — Discharge Instructions (Addendum)
No Tylenol until 3:20 pm today   POSTOPERATIVE INSTRUCTIONS FOR PATIENTS HAVING NASAL OR SINUS OPERATIONS ACTIVITY: Restrict activity at home for the first two days, resting as much as possible. Light activity is best. You may usually return to work within a week. You should refrain from nose blowing, strenuous activity, or heavy lifting greater than 20lbs for a total of three weeks after your operation.  If sneezing cannot be avoided, sneeze with your mouth open. DISCOMFORT: You may experience a dull headache and pressure along with nasal congestion and discharge. These symptoms may be worse during the first week after the operation but may last as long as two to four weeks.  Please take Tylenol or the pain medication that has been prescribed for you. Do not take aspirin or aspirin containing medications since they may cause bleeding.  You may experience symptoms of post nasal drainage, nasal congestion, headaches and fatigue for two or three months after your operation.  BLEEDING: You may have some blood tinged nasal drainage for approximately two weeks after the operation.  The discharge will be worse for the first week.  Please call our office at [phone] or go to the nearest hospital emergency room if you experience any of the following: heavy, bright red blood from your nose or mouth that lasts longer than ten minutes or coughing up or vomiting bright red blood or blood clots. GENERAL CONSIDERATIONS: 1. A gauze dressing will be placed on your upper lip to absorb any drainage after the operation. You may need to change this several times a day.  If you do not have very much drainage, you may remove the dressing.  Remember that you may gently wipe your nose with a tissue and sniff in, but DO NOT blow your nose. 2. Please keep all of your postoperative appointments.  Your final results after the operation will depend on proper follow-up.  The initial visit is usually four to seven days after the  operation.  During this visit, the remaining nasal packing and internal septal splints will be removed.  Your nasal and sinus cavities will be cleaned.  During the second visit, your nasal and sinus cavities will be cleaned again. Have someone drive you to your first two postoperative appointments. We suggest that you take your prescribed pain medication about  hour prior to each of these two appointments.  3. How you care for your nose after the operation will influence the results that you obtain.  You should follow all directions, take your medication as prescribed, and call our office [phone] with any problems or questions. 4. You may be more comfortable sleeping with your head elevated on two pillows. 5. Do not take any medications that we have not prescribed or recommended. WARNING SIGNS: if any of the following should occur, please call our office: 1. Bright red bleeding which lasts more than 10 minutes. 2. Persistent fever greater than 102F. 3. Persistent vomiting. 4. Severe and constant pain that is not relieved by prescribed pain medication. 5. Trauma to the nose. 6. Rash or unusual side effects from any medicines.   Post Anesthesia Home Care Instructions  Activity: Get plenty of rest for the remainder of the day. A responsible individual must stay with you for 24 hours following the procedure.  For the next 24 hours, DO NOT: -Drive a car -Advertising copywriterperate machinery -Drink alcoholic beverages -Take any medication unless instructed by your physician -Make any legal decisions or sign important papers.  Meals: Start with liquid  foods such as gelatin or soup. Progress to regular foods as tolerated. Avoid greasy, spicy, heavy foods. If nausea and/or vomiting occur, drink only clear liquids until the nausea and/or vomiting subsides. Call your physician if vomiting continues.  Special Instructions/Symptoms: Your throat may feel dry or sore from the anesthesia or the breathing tube placed  in your throat during surgery. If this causes discomfort, gargle with warm salt water. The discomfort should disappear within 24 hours.  If you had a scopolamine patch placed behind your ear for the management of post- operative nausea and/or vomiting:  1. The medication in the patch is effective for 72 hours, after which it should be removed.  Wrap patch in a tissue and discard in the trash. Wash hands thoroughly with soap and water. 2. You may remove the patch earlier than 72 hours if you experience unpleasant side effects which may include dry mouth, dizziness or visual disturbances. 3. Avoid touching the patch. Wash your hands with soap and water after contact with the patch.      Post Anesthesia Home Care Instructions  Activity: Get plenty of rest for the remainder of the day. A responsible individual must stay with you for 24 hours following the procedure.  For the next 24 hours, DO NOT: -Drive a car -Paediatric nurse -Drink alcoholic beverages -Take any medication unless instructed by your physician -Make any legal decisions or sign important papers.  Meals: Start with liquid foods such as gelatin or soup. Progress to regular foods as tolerated. Avoid greasy, spicy, heavy foods. If nausea and/or vomiting occur, drink only clear liquids until the nausea and/or vomiting subsides. Call your physician if vomiting continues.  Special Instructions/Symptoms: Your throat may feel dry or sore from the anesthesia or the breathing tube placed in your throat during surgery. If this causes discomfort, gargle with warm salt water. The discomfort should disappear within 24 hours.  If you had a scopolamine patch placed behind your ear for the management of post- operative nausea and/or vomiting:  1. The medication in the patch is effective for 72 hours, after which it should be removed.  Wrap patch in a tissue and discard in the trash. Wash hands thoroughly with soap and water. 2. You may  remove the patch earlier than 72 hours if you experience unpleasant side effects which may include dry mouth, dizziness or visual disturbances. 3. Avoid touching the patch. Wash your hands with soap and water after contact with the patch.

## 2018-12-25 NOTE — Anesthesia Preprocedure Evaluation (Addendum)
Anesthesia Evaluation  Patient identified by MRN, date of birth, ID band Patient awake    Reviewed: Allergy & Precautions, NPO status , Patient's Chart, lab work & pertinent test results  Airway Mallampati: I  TM Distance: >3 FB Neck ROM: Full    Dental no notable dental hx. (+) Teeth Intact, Dental Advisory Given   Pulmonary sleep apnea and Continuous Positive Airway Pressure Ventilation ,    Pulmonary exam normal breath sounds clear to auscultation       Cardiovascular negative cardio ROS Normal cardiovascular exam Rhythm:Regular Rate:Normal     Neuro/Psych negative neurological ROS  negative psych ROS   GI/Hepatic negative GI ROS, Neg liver ROS,   Endo/Other  negative endocrine ROS  Renal/GU negative Renal ROS  negative genitourinary   Musculoskeletal negative musculoskeletal ROS (+)   Abdominal   Peds  Hematology negative hematology ROS (+)   Anesthesia Other Findings   Reproductive/Obstetrics                            Anesthesia Physical Anesthesia Plan  ASA: II  Anesthesia Plan: General   Post-op Pain Management:    Induction: Intravenous  PONV Risk Score and Plan: 2 and Ondansetron, Dexamethasone and Midazolam  Airway Management Planned: Oral ETT  Additional Equipment:   Intra-op Plan:   Post-operative Plan: Extubation in OR  Informed Consent: I have reviewed the patients History and Physical, chart, labs and discussed the procedure including the risks, benefits and alternatives for the proposed anesthesia with the patient or authorized representative who has indicated his/her understanding and acceptance.     Dental advisory given  Plan Discussed with: CRNA  Anesthesia Plan Comments:         Anesthesia Quick Evaluation

## 2018-12-25 NOTE — Transfer of Care (Signed)
Immediate Anesthesia Transfer of Care Note  Patient: John Weber  Procedure(s) Performed: NASAL SEPTOPLASTY WITH TURBINATE REDUCTION (Bilateral Nose)  Patient Location: PACU  Anesthesia Type:General  Level of Consciousness: awake, alert  and oriented  Airway & Oxygen Therapy: Patient Spontanous Breathing and Patient connected to face mask oxygen  Post-op Assessment: Report given to RN and Post -op Vital signs reviewed and stable  Post vital signs: Reviewed and stable  Last Vitals:  Vitals Value Taken Time  BP    Temp    Pulse 99 12/25/18 1038  Resp    SpO2 100 % 12/25/18 1038  Vitals shown include unvalidated device data.  Last Pain:  Vitals:   12/25/18 0800  TempSrc: Oral  PainSc: 0-No pain         Complications: No apparent anesthesia complications

## 2018-12-26 ENCOUNTER — Encounter (HOSPITAL_BASED_OUTPATIENT_CLINIC_OR_DEPARTMENT_OTHER): Payer: Self-pay | Admitting: Otolaryngology

## 2018-12-26 NOTE — Anesthesia Postprocedure Evaluation (Signed)
Anesthesia Post Note  Patient: John Weber  Procedure(s) Performed: NASAL SEPTOPLASTY WITH TURBINATE REDUCTION (Bilateral Nose)     Patient location during evaluation: PACU Anesthesia Type: General Level of consciousness: awake and alert Pain management: pain level controlled Vital Signs Assessment: post-procedure vital signs reviewed and stable Respiratory status: spontaneous breathing, nonlabored ventilation, respiratory function stable and patient connected to nasal cannula oxygen Cardiovascular status: blood pressure returned to baseline and stable Postop Assessment: no apparent nausea or vomiting Anesthetic complications: no    Last Vitals:  Vitals:   12/25/18 1130 12/25/18 1145  BP: 120/66 129/87  Pulse: 88 85  Resp: (!) 9 16  Temp:  36.9 C  SpO2: 97% 97%    Last Pain:  Vitals:   12/25/18 1145  TempSrc:   PainSc: 5    Pain Goal: Patients Stated Pain Goal: 3 (12/25/18 1055)                 Amos Micheals L Pollyann Roa

## 2019-02-27 ENCOUNTER — Other Ambulatory Visit: Payer: Self-pay | Admitting: Gastroenterology

## 2019-02-27 DIAGNOSIS — R7989 Other specified abnormal findings of blood chemistry: Secondary | ICD-10-CM

## 2019-03-06 ENCOUNTER — Ambulatory Visit
Admission: RE | Admit: 2019-03-06 | Discharge: 2019-03-06 | Disposition: A | Discharge status: Home / Self Care | Payer: BC Managed Care – PPO | Source: Ambulatory Visit | Attending: Gastroenterology | Admitting: Gastroenterology

## 2019-03-06 DIAGNOSIS — R7989 Other specified abnormal findings of blood chemistry: Secondary | ICD-10-CM

## 2019-03-09 ENCOUNTER — Other Ambulatory Visit: Payer: Self-pay

## 2019-03-09 DIAGNOSIS — Z20822 Contact with and (suspected) exposure to covid-19: Secondary | ICD-10-CM

## 2019-03-11 LAB — NOVEL CORONAVIRUS, NAA: SARS-CoV-2, NAA: NOT DETECTED

## 2019-06-05 ENCOUNTER — Telehealth (INDEPENDENT_AMBULATORY_CARE_PROVIDER_SITE_OTHER): Payer: BC Managed Care – PPO | Admitting: Adult Health

## 2019-06-05 DIAGNOSIS — Z9989 Dependence on other enabling machines and devices: Secondary | ICD-10-CM | POA: Diagnosis not present

## 2019-06-05 DIAGNOSIS — G4733 Obstructive sleep apnea (adult) (pediatric): Secondary | ICD-10-CM

## 2019-06-05 NOTE — Progress Notes (Signed)
PATIENT: John Weber DOB: 1990/10/05  REASON FOR VISIT: follow up HISTORY FROM: patient  Virtual Visit via Video Note  I connected with Jeannie Fend on 06/05/19 at  2:00 PM EST by a video enabled telemedicine application located remotely at [facility] and verified that I am speaking with the correct person using two identifiers who was located at their own home.   I discussed the limitations of evaluation and management by telemedicine and the availability of in person appointments. The patient expressed understanding and agreed to proceed.   PATIENT: John Weber DOB: 1991-05-08  REASON FOR VISIT: follow up HISTORY FROM: patient  HISTORY OF PRESENT ILLNESS: Today 06/05/19:  Mr. John Weber is a 29 year old male with a history of obstructive sleep apnea on CPAP.  He returns today for a virtual visit.  He reports that he does not use the CPAP consistently.  His download shows that he only use the machine 5 the last 30 days.  There is only one night that he uses greater than 4 hours.  His pressure is currently set at 4 to 8 cm of water.  He states that initially when he puts on CPAP is comfortable but he wakes up in the middle the night and feels that the pressure is too strong.  The patient also states that he had nasal surgery since his last visit.  He states that he had a broken nose and this was repaired he also had a bone spur that was causing congestion and this was also repaired.  He states that since his surgery he does feel that he sleeps better and feels well rested the next day.  He does note that his wife reports that he snores but possibly not as bad as before.  He returns today for an evaluation.  HISTORY 11/20/18 Mr. John Weber is a 29 year old male with a history of obstructive sleep apnea on CPAP.  He returns today for a virtual visit.  His download indicates that he has not used his machine very much in the last month.  He states that he has had chronic  sinus infection and due to congestion he has been unable to use the machine.  He states that he has surgery scheduled for August 3 for his sinuses.  He states when he was using machine consistently he noticed the benefit.  He states that since has not been using it he can tell that it is affecting his sleep.  He has tried using a fullface mask but due to claustrophobia he has a hard time keeping it on.  REVIEW OF SYSTEMS: Out of a complete 14 system review of symptoms, the patient complains only of the following symptoms, and all other reviewed systems are negative.  See HPI  ALLERGIES: Allergies  Allergen Reactions  . Penicillins Anaphylaxis    HOME MEDICATIONS: No outpatient medications prior to visit.   No facility-administered medications prior to visit.    PAST MEDICAL HISTORY: Past Medical History:  Diagnosis Date  . Sleep apnea   . Syncope    vagal at restaurant    PAST SURGICAL HISTORY: Past Surgical History:  Procedure Laterality Date  . NASAL SEPTOPLASTY W/ TURBINOPLASTY Bilateral 12/25/2018   Procedure: NASAL SEPTOPLASTY WITH TURBINATE REDUCTION;  Surgeon: Leta Baptist, MD;  Location: [place];  Service: ENT;  Laterality: Bilateral;  . WISDOM TOOTH EXTRACTION      FAMILY HISTORY: Family History  Problem Relation Age of Onset  .  Rectal cancer Father     SOCIAL HISTORY: Social History   Socioeconomic History  . Marital status: Married    Spouse name: Not on file  . Number of children: Not on file  . Years of education: Not on file  . Highest education level: Not on file  Occupational History  . Not on file  Tobacco Use  . Smoking status: Never Smoker  . Smokeless tobacco: Never Used  Substance and Sexual Activity  . Alcohol use: No  . Drug use: Never  . Sexual activity: Not on file  Other Topics Concern  . Not on file  Social History Narrative  . Not on file   Social Determinants of Health   Financial Resource Strain:   .  Difficulty of Paying Living Expenses: Not on file  Food Insecurity:   . Worried About Programme researcher, broadcasting/film/video in the Last Year: Not on file  . Ran Out of Food in the Last Year: Not on file  Transportation Needs:   . Lack of Transportation (Medical): Not on file  . Lack of Transportation (Non-Medical): Not on file  Physical Activity:   . Days of Exercise per Week: Not on file  . Minutes of Exercise per Session: Not on file  Stress:   . Feeling of Stress : Not on file  Social Connections:   . Frequency of Communication with Friends and Family: Not on file  . Frequency of Social Gatherings with Friends and Family: Not on file  . Attends Religious Services: Not on file  . Active Member of Clubs or Organizations: Not on file  . Attends Banker Meetings: Not on file  . Marital Status: Not on file  Intimate Partner Violence:   . Fear of Current or Ex-Partner: Not on file  . Emotionally Abused: Not on file  . Physically Abused: Not on file  . Sexually Abused: Not on file      PHYSICAL EXAM Generalized: Well developed, in no acute distress   Neurological examination  Mentation: Alert oriented to time, place, history taking. Follows all commands speech and language fluent Cranial nerve II-XII:Extraocular movements were full. Facial symmetry noted. Head turning and shoulder shrug  were normal and symmetric. Motor: Good strength throughout subjectively per patient Sensory: Sensory testing is intact to soft touch on all 4 extremities subjectively per patient Coordination: Cerebellar testing reveals good finger-nose-finger   Reflexes: UTA  DIAGNOSTIC DATA (LABS, IMAGING, TESTING) - I reviewed patient records, labs, notes, testing and imaging myself where available.  Lab Results  Component Value Date   WBC 9.9 10/20/2016   HGB 15.2 10/20/2016   HCT 44.5 10/20/2016   MCV 85.2 10/20/2016   PLT 238 10/20/2016      Component Value Date/Time   NA 136 10/20/2016 1908   K 3.7  10/20/2016 1908   CL 102 10/20/2016 1908   CO2 27 10/20/2016 1908   GLUCOSE 109 (H) 10/20/2016 1908   BUN 10 10/20/2016 1908   CREATININE 1.17 10/20/2016 1908   CALCIUM 9.1 10/20/2016 1908   PROT 6.9 10/20/2016 1908   ALBUMIN 4.4 10/20/2016 1908   AST 54 (H) 10/20/2016 1908   ALT 100 (H) 10/20/2016 1908   ALKPHOS 52 10/20/2016 1908   BILITOT 0.8 10/20/2016 1908   GFRNONAA >60 10/20/2016 1908   GFRAA >60 10/20/2016 1908      ASSESSMENT AND PLAN 29 y.o. year old male  has a past medical history of Sleep apnea and Syncope. here with:  1.  Obstructive sleep apnea on CPAP  The patient is not using the CPAP consistently.  Since nasal surgery he does feel that his sleep has improved.  We will complete a home sleep test to see if apnea is still present.  Patient is amenable to this plan.  He is advised that if his symptoms worsen or he develops new symptoms he should let us know.  He will follow-up  pending his home sleep test.   I spent 15 minutes with the patient. 50% of this time was spent discussing plan of care   Butch Penny, MSN, NP-C 06/05/2019, 2:32 PM [facility] [address], [address] [place], [place] [address] [phone]

## 2019-06-05 NOTE — Addendum Note (Signed)
Addended by: Enedina Finner on: 06/05/2019 03:47 PM   Modules accepted: Orders

## 2019-06-06 ENCOUNTER — Other Ambulatory Visit: Payer: Self-pay | Admitting: Neurology

## 2019-06-06 DIAGNOSIS — R0683 Snoring: Secondary | ICD-10-CM

## 2019-06-06 DIAGNOSIS — Z9989 Dependence on other enabling machines and devices: Secondary | ICD-10-CM

## 2019-06-06 DIAGNOSIS — G4730 Sleep apnea, unspecified: Secondary | ICD-10-CM

## 2019-06-06 DIAGNOSIS — R5383 Other fatigue: Secondary | ICD-10-CM

## 2019-06-06 DIAGNOSIS — G4733 Obstructive sleep apnea (adult) (pediatric): Secondary | ICD-10-CM

## 2019-07-10 ENCOUNTER — Telehealth: Payer: Self-pay

## 2019-07-10 NOTE — Telephone Encounter (Signed)
We have attempted to call the patient three times to schedule sleep study.  Patient has been unavailable at the phone numbers we have on file and has not returned our calls.  If patient calls back we will schedule them for their sleep study.  

## 2019-08-02 ENCOUNTER — Emergency Department (HOSPITAL_BASED_OUTPATIENT_CLINIC_OR_DEPARTMENT_OTHER)
Admission: EM | Admit: 2019-08-02 | Discharge: 2019-08-02 | Disposition: A | Discharge status: Home / Self Care | Payer: BC Managed Care – PPO | Attending: Emergency Medicine | Admitting: Emergency Medicine

## 2019-08-02 ENCOUNTER — Encounter (HOSPITAL_BASED_OUTPATIENT_CLINIC_OR_DEPARTMENT_OTHER): Payer: Self-pay

## 2019-08-02 ENCOUNTER — Other Ambulatory Visit: Payer: Self-pay

## 2019-08-02 ENCOUNTER — Emergency Department (HOSPITAL_BASED_OUTPATIENT_CLINIC_OR_DEPARTMENT_OTHER): Payer: BC Managed Care – PPO

## 2019-08-02 DIAGNOSIS — M5442 Lumbago with sciatica, left side: Secondary | ICD-10-CM | POA: Insufficient documentation

## 2019-08-02 DIAGNOSIS — Z79899 Other long term (current) drug therapy: Secondary | ICD-10-CM | POA: Insufficient documentation

## 2019-08-02 DIAGNOSIS — M62830 Muscle spasm of back: Secondary | ICD-10-CM | POA: Diagnosis not present

## 2019-08-02 DIAGNOSIS — M545 Low back pain: Secondary | ICD-10-CM | POA: Diagnosis present

## 2019-08-02 DIAGNOSIS — Z880 Allergy status to penicillin: Secondary | ICD-10-CM | POA: Insufficient documentation

## 2019-08-02 DIAGNOSIS — M5440 Lumbago with sciatica, unspecified side: Secondary | ICD-10-CM

## 2019-08-02 MED ORDER — KETOROLAC TROMETHAMINE 15 MG/ML IJ SOLN
15.0000 mg | Freq: Once | INTRAMUSCULAR | Status: AC
  Administered 2019-08-02: 15 mg via INTRAMUSCULAR
  Filled 2019-08-02: qty 1

## 2019-08-02 MED ORDER — PREDNISONE 20 MG PO TABS: 40.0000 mg | ORAL_TABLET | Freq: Every day | ORAL | 0 refills | Status: AC

## 2019-08-02 MED ORDER — OXYCODONE HCL 5 MG PO TABS
5.0000 mg | ORAL_TABLET | Freq: Once | ORAL | Status: AC
  Administered 2019-08-02: 22:00:00 5 mg via ORAL
  Filled 2019-08-02: qty 1

## 2019-08-02 MED ORDER — ACETAMINOPHEN 500 MG PO TABS
1000.0000 mg | ORAL_TABLET | Freq: Once | ORAL | Status: AC
  Administered 2019-08-02: 22:00:00 1000 mg via ORAL
  Filled 2019-08-02: qty 2

## 2019-08-02 MED ORDER — CYCLOBENZAPRINE HCL 10 MG PO TABS: 10.0000 mg | ORAL_TABLET | Freq: Two times a day (BID) | ORAL | 0 refills | Status: AC | PRN

## 2019-08-02 MED ORDER — DIAZEPAM 5 MG PO TABS
5.0000 mg | ORAL_TABLET | Freq: Once | ORAL | Status: AC
  Administered 2019-08-02: 22:00:00 5 mg via ORAL
  Filled 2019-08-02: qty 1

## 2019-08-02 NOTE — Discharge Instructions (Addendum)
I have prescribed muscle relaxers for your pain, please do not drink or drive while taking this medications as it can make you drowsy.   I have also prescribed steroids, he is to be advised this medication can cause insomnia, appetite changes.    Please follow-up with PCP in 1 week for reevaluation of your symptoms.   If  you experience any bowel or bladder incontinence, fever, worsening in your symptoms please return to the ED.

## 2019-08-02 NOTE — ED Provider Notes (Signed)
[facility] EMERGENCY DEPARTMENT Provider Note   CSN: [id number] Arrival date & time: 08/02/19  1927     History Chief Complaint  Patient presents with  . Back Pain    John Weber is a 29 y.o. male.  29 y.o male with a PMH of muscle strain presents to the ED with a chief complaint of low back pain x 3 hours prior to arrival. Had a similar episode in the past with muscle spasms but this began after bending over to pick up object.   The history is provided by the patient.  Back Pain Location:  Lumbar spine Quality:  Cramping and aching Radiates to:  L knee Pain severity:  Moderate Pain is:  Same all the time Onset quality:  Sudden Duration:  3 hours Timing:  Constant Chronicity:  Recurrent Context: lifting heavy objects   Worsened by:  Movement and bending Ineffective treatments:  Ibuprofen and lying down Associated symptoms: no abdominal pain, no dysuria, no fever, no numbness and no tingling   Risk factors: no hx of cancer and no recent surgery        Past Medical History:  Diagnosis Date  . Sleep apnea   . Syncope    vagal at restaurant    Patient Active Problem List   Diagnosis Date Noted  . Obstructive sleep apnea treated with continuous positive airway pressure (CPAP) 07/12/2018  . Snoring 02/20/2018  . Sleep apnea in adult 02/20/2018  . Heart palpitations 02/20/2018  . Syncope 02/20/2018    Past Surgical History:  Procedure Laterality Date  . NASAL SEPTOPLASTY W/ TURBINOPLASTY Bilateral 12/25/2018   Procedure: NASAL SEPTOPLASTY WITH TURBINATE REDUCTION;  Surgeon: Newman Pies, MD;  Location: [facility];  Service: ENT;  Laterality: Bilateral;  . WISDOM TOOTH EXTRACTION         Family History  Problem Relation Age of Onset  . Rectal cancer Father     Social History   Tobacco Use  . Smoking status: Never Smoker  . Smokeless tobacco: Never Used  Substance Use Topics  . Alcohol use: No  . Drug use: Never    Home  Medications Prior to Admission medications   Medication Sig Start Date End Date Taking? Authorizing Provider  cyclobenzaprine (FLEXERIL) 10 MG tablet Take 1 tablet (10 mg total) by mouth 2 (two) times daily as needed for up to 5 days for muscle spasms. 08/02/19 08/07/19  Claude Manges, PA-C  fluticasone (FLONASE) 50 MCG/ACT nasal spray  04/30/19   [provider]  ipratropium (ATROVENT) 0.06 % nasal spray Place 2 sprays into both nostrils 3 (three) times daily. 07/18/19   [provider]  levocetirizine (XYZAL) 5 MG tablet Take 5 mg by mouth daily. 07/27/19   [provider]  predniSONE (DELTASONE) 20 MG tablet Take 2 tablets (40 mg total) by mouth daily for 5 days. 08/02/19 08/07/19  Claude Manges, PA-C    Allergies    Penicillins  Review of Systems   Review of Systems  Constitutional: Negative for fever.  Gastrointestinal: Negative for abdominal pain.  Genitourinary: Negative for dysuria, flank pain and testicular pain.  Musculoskeletal: Positive for back pain.  Neurological: Negative for tingling and numbness.    Physical Exam Updated Vital Signs BP (!) 149/97 (BP Location: Left Arm)   Pulse 74   Temp 97.9 F (36.6 C) (Oral)   Resp 16   Ht 6\' 3"  (1.905 m)   Wt 106.4 kg   SpO2 100%   BMI  29.31 kg/m   Physical Exam Vitals and nursing note reviewed.  Constitutional:      Appearance: Normal appearance.  HENT:     Head: Normocephalic and atraumatic.     Nose: Nose normal.     Mouth/Throat:     Mouth: Mucous membranes are moist.  Eyes:     Pupils: Pupils are equal, round, and reactive to light.  Cardiovascular:     Rate and Rhythm: Normal rate.  Pulmonary:     Effort: Pulmonary effort is normal.     Breath sounds: Normal breath sounds. No wheezing or rales.  Abdominal:     General: Abdomen is flat.     Tenderness: There is no abdominal tenderness.  Musculoskeletal:     Cervical back: Normal range of motion and neck supple.     Comments: RLE- KF,KE  5/5 strength LLE- HF, HE 5/5 strength Normal gait. No pronator drift. No leg drop.  Patellar reflexes present and symmetric. CN I, II and VIII not tested. CN II-XII grossly intact bilaterally.     Skin:    General: Skin is warm and dry.  Neurological:     Mental Status: He is alert and oriented to person, place, and time.     ED Results / Procedures / Treatments   Labs (all labs ordered are listed, but only abnormal results are displayed) Labs Reviewed - No data to display  EKG None  Radiology DG Lumbar Spine Complete  Result Date: 08/02/2019 CLINICAL DATA:  29 year old male with low back pain and spasm tonight after bending injury. EXAM: LUMBAR SPINE - COMPLETE 4+ VIEW COMPARISON:  None. FINDINGS: Normal lumbar segmentation. Straightening of lumbar lordosis. Relatively preserved disc spaces. No pars fracture. Visible lower thoracic levels, sacrum and SI joints appear intact. Mild lower lumbar facet hypertrophy. No acute osseous abnormality identified. Negative abdominal visceral contours. IMPRESSION: Negative; no acute osseous abnormality identified in the lumbar spine. Electronically Signed   By: Odessa Fleming M.D.   On: 08/02/2019 21:29    Procedures Procedures (including critical care time)  Medications Ordered in ED Medications  diazepam (VALIUM) tablet 5 mg (5 mg Oral Given 08/02/19 2131)  acetaminophen (TYLENOL) tablet 1,000 mg (1,000 mg Oral Given 08/02/19 2131)  ketorolac (TORADOL) 15 MG/ML injection 15 mg (15 mg Intramuscular Given 08/02/19 2132)  oxyCODONE (Oxy IR/ROXICODONE) immediate release tablet 5 mg (5 mg Oral Given 08/02/19 2131)    ED Course  I have reviewed the triage vital signs and the nursing notes.  Pertinent labs & imaging results that were available during my care of the patient were reviewed by me and considered in my medical decision making (see chart for details).    MDM Rules/Calculators/A&P   Patient with a past medical history of muscle strain  presents to the ED with low back pain after bending his back while attempting to pick up an object. He has taken advil prior to arrival without improvement in symptoms. No red flags such as fever, bowel/bladder complaints, prior history of CA or saddle numbness.   Patient is able to stand and has a cramping sensation to the left side of his back with radiation down his left leg. He was provided with medication to help with his pain, xray of the lumbar spine without any acute process.Neuro exam is reassuring but does have a antalgic gate. Vitals are within normal limits.    10:09 PM patient reassessed by me, continues to complain of muscle spasms.  Results of his x-rays were discussed  with him at length.  He denies any prior history of diabetes, we will provide him with a short prescription of steroid burst along with muscle relaxers.  He is also provided with a work note in order to heal his muscle strain.  Red flags have been discussed with him and he is aware he will need to return if his symptoms worsen.  Portions of this note were generated with [employer]. Dictation errors may occur despite best attempts at proofreading.  Final Clinical Impression(s) / ED Diagnoses Final diagnoses:  Acute left-sided low back pain with sciatica, sciatica laterality unspecified  Muscle spasm of back    Rx / DC Orders ED Discharge Orders         Ordered    predniSONE (DELTASONE) 20 MG tablet  Daily     08/02/19 2207    cyclobenzaprine (FLEXERIL) 10 MG tablet  2 times daily PRN     08/02/19 2207           Janeece Fitting, PA-C 08/02/19 2213    Sherwood Gambler, MD 08/02/19 2315

## 2019-08-02 NOTE — ED Triage Notes (Signed)
Pt c/o lower back pain/spasms after bending ~5pm-NAD-slow gait

## 2020-06-23 DIAGNOSIS — Z20828 Contact with and (suspected) exposure to other viral communicable diseases: Secondary | ICD-10-CM | POA: Diagnosis not present

## 2020-06-23 DIAGNOSIS — R509 Fever, unspecified: Secondary | ICD-10-CM | POA: Diagnosis not present

## 2021-03-09 DIAGNOSIS — R7989 Other specified abnormal findings of blood chemistry: Secondary | ICD-10-CM | POA: Diagnosis not present

## 2021-03-09 DIAGNOSIS — Z0000 Encounter for general adult medical examination without abnormal findings: Secondary | ICD-10-CM | POA: Diagnosis not present

## 2021-03-16 DIAGNOSIS — Z1389 Encounter for screening for other disorder: Secondary | ICD-10-CM | POA: Diagnosis not present

## 2021-03-16 DIAGNOSIS — Z0000 Encounter for general adult medical examination without abnormal findings: Secondary | ICD-10-CM | POA: Diagnosis not present

## 2021-03-16 DIAGNOSIS — Z1331 Encounter for screening for depression: Secondary | ICD-10-CM | POA: Diagnosis not present

## 2021-03-18 DIAGNOSIS — R1084 Generalized abdominal pain: Secondary | ICD-10-CM | POA: Diagnosis not present

## 2021-03-18 DIAGNOSIS — R509 Fever, unspecified: Secondary | ICD-10-CM | POA: Diagnosis not present

## 2021-03-18 DIAGNOSIS — Z20828 Contact with and (suspected) exposure to other viral communicable diseases: Secondary | ICD-10-CM | POA: Diagnosis not present

## 2021-05-28 DIAGNOSIS — J069 Acute upper respiratory infection, unspecified: Secondary | ICD-10-CM | POA: Diagnosis not present

## 2021-05-28 DIAGNOSIS — R509 Fever, unspecified: Secondary | ICD-10-CM | POA: Diagnosis not present

## 2021-05-28 DIAGNOSIS — Z20828 Contact with and (suspected) exposure to other viral communicable diseases: Secondary | ICD-10-CM | POA: Diagnosis not present

## 2021-05-31 DIAGNOSIS — J039 Acute tonsillitis, unspecified: Secondary | ICD-10-CM | POA: Diagnosis not present

## 2022-01-03 DIAGNOSIS — J029 Acute pharyngitis, unspecified: Secondary | ICD-10-CM | POA: Diagnosis not present

## 2022-02-18 ENCOUNTER — Encounter (HOSPITAL_BASED_OUTPATIENT_CLINIC_OR_DEPARTMENT_OTHER): Payer: Self-pay

## 2022-02-18 ENCOUNTER — Emergency Department (HOSPITAL_BASED_OUTPATIENT_CLINIC_OR_DEPARTMENT_OTHER)
Admission: EM | Admit: 2022-02-18 | Discharge: 2022-02-19 | Disposition: A | Discharge status: Home / Self Care | Payer: Federal, State, Local not specified - PPO | Attending: Emergency Medicine | Admitting: Emergency Medicine

## 2022-02-18 DIAGNOSIS — M791 Myalgia, unspecified site: Secondary | ICD-10-CM | POA: Diagnosis not present

## 2022-02-18 DIAGNOSIS — E860 Dehydration: Secondary | ICD-10-CM | POA: Diagnosis not present

## 2022-02-18 DIAGNOSIS — K120 Recurrent oral aphthae: Secondary | ICD-10-CM | POA: Insufficient documentation

## 2022-02-18 DIAGNOSIS — Z20822 Contact with and (suspected) exposure to covid-19: Secondary | ICD-10-CM | POA: Diagnosis not present

## 2022-02-18 DIAGNOSIS — R000 Tachycardia, unspecified: Secondary | ICD-10-CM | POA: Diagnosis not present

## 2022-02-18 DIAGNOSIS — M549 Dorsalgia, unspecified: Secondary | ICD-10-CM | POA: Diagnosis not present

## 2022-02-18 DIAGNOSIS — R55 Syncope and collapse: Secondary | ICD-10-CM | POA: Insufficient documentation

## 2022-02-18 DIAGNOSIS — R197 Diarrhea, unspecified: Secondary | ICD-10-CM | POA: Insufficient documentation

## 2022-02-18 DIAGNOSIS — R112 Nausea with vomiting, unspecified: Secondary | ICD-10-CM | POA: Insufficient documentation

## 2022-02-18 DIAGNOSIS — R509 Fever, unspecified: Secondary | ICD-10-CM | POA: Diagnosis not present

## 2022-02-18 LAB — CBC
HCT: 47.6 % (ref 39.0–52.0)
Hemoglobin: 17 g/dL (ref 13.0–17.0)
MCH: 30 pg (ref 26.0–34.0)
MCHC: 35.7 g/dL (ref 30.0–36.0)
MCV: 84 fL (ref 80.0–100.0)
Platelets: 236 10*3/uL (ref 150–400)
RBC: 5.67 MIL/uL (ref 4.22–5.81)
RDW: 12.9 % (ref 11.5–15.5)
WBC: 10.4 10*3/uL (ref 4.0–10.5)
nRBC: 0 % (ref 0.0–0.2)

## 2022-02-18 LAB — COMPREHENSIVE METABOLIC PANEL
ALT: 24 U/L (ref 0–44)
AST: 28 U/L (ref 15–41)
Albumin: 5 g/dL (ref 3.5–5.0)
Alkaline Phosphatase: 48 U/L (ref 38–126)
Anion gap: 11 (ref 5–15)
BUN: 16 mg/dL (ref 6–20)
CO2: 24 mmol/L (ref 22–32)
Calcium: 9.4 mg/dL (ref 8.9–10.3)
Chloride: 99 mmol/L (ref 98–111)
Creatinine, Ser: 1.15 mg/dL (ref 0.61–1.24)
GFR, Estimated: >60 mL/min (ref >60)
Glucose, Bld: 100 mg/dL — ABNORMAL HIGH (ref 70–99)
Potassium: 3.7 mmol/L (ref 3.5–5.1)
Sodium: 134 mmol/L — ABNORMAL LOW (ref 135–145)
Total Bilirubin: 1 mg/dL (ref 0.3–1.2)
Total Protein: 7.8 g/dL (ref 6.5–8.1)

## 2022-02-18 LAB — URINALYSIS, ROUTINE W REFLEX MICROSCOPIC
Bilirubin Urine: NEGATIVE
Glucose, UA: NEGATIVE mg/dL
Hgb urine dipstick: NEGATIVE
Leukocytes,Ua: NEGATIVE
Nitrite: NEGATIVE
Protein, ur: 30 mg/dL — AB
Specific Gravity, Urine: 1.035 — ABNORMAL HIGH (ref 1.005–1.030)
pH: 6 (ref 5.0–8.0)

## 2022-02-18 LAB — SARS CORONAVIRUS 2 BY RT PCR: SARS Coronavirus 2 by RT PCR: NEGATIVE

## 2022-02-18 LAB — MAGNESIUM: Magnesium: 1.7 mg/dL (ref 1.7–2.4)

## 2022-02-18 LAB — LACTIC ACID, PLASMA: Lactic Acid, Venous: 0.7 mmol/L (ref 0.5–1.9)

## 2022-02-18 LAB — LIPASE, BLOOD: Lipase: 23 U/L (ref 11–51)

## 2022-02-18 MED ORDER — LOPERAMIDE HCL 2 MG PO CAPS: 2.0000 mg | ORAL_CAPSULE | Freq: Four times a day (QID) | ORAL | 0 refills | Status: DC | PRN

## 2022-02-18 MED ORDER — LACTATED RINGERS IV BOLUS
1000.0000 mL | Freq: Once | INTRAVENOUS | Status: AC
  Administered 2022-02-18: 1000 mL via INTRAVENOUS

## 2022-02-18 MED ORDER — ONDANSETRON HCL 4 MG PO TABS: 4.0000 mg | ORAL_TABLET | Freq: Three times a day (TID) | ORAL | 0 refills | Status: DC | PRN

## 2022-02-18 NOTE — ED Triage Notes (Addendum)
Vomiting and diarrhea since Tuesday Visited grandmother in hospital that had C.DIFF a month ago Dtg has had same symptoms Was seen at [facility] and they recommended he come here for further evaluation Did [facility]gan [facility] Has not voided in 24 hrs Diarrhea 25x in 24 hrs  Pt had syncopal episode in triage, diaphoretic 105/67, hr 77 sats 96% IV started and labs obtained

## 2022-02-18 NOTE — ED Provider Notes (Signed)
[facility] EMERGENCY DEPT Provider Note   CSN: [id number] Arrival date & time: 02/18/22  1949     History  Chief Complaint  Patient presents with   Emesis   Diarrhea    John Weber is a 31 y.o. male.  He is complaining of nausea vomiting diarrhea that is been going on for the last 3 days.  Nonbloody. associated with pain in his back which he thinks is from retching.  He is also had a few sores in his mouth.  Fever to 103.  Went to urgent care where they gave him a shot of Phenergan and sent him here for evaluation.  Of note he was exposed to someone who had C. difficile about 3 weeks ago.  No recent antibiotics.  No recent travel.  He is not sure if he is urinated today.  No sore throat or cough  The history is provided by the patient.  Emesis Severity:  Severe Duration:  3 days Timing:  Intermittent Progression:  Unchanged Chronicity:  New Recent urination:  Absent Relieved by:  None tried Worsened by:  Liquids Ineffective treatments:  None tried Associated symptoms: diarrhea, fever and myalgias   Associated symptoms: no abdominal pain, no cough and no sore throat   Risk factors: sick contacts   Diarrhea Associated symptoms: fever, myalgias and vomiting   Associated symptoms: no abdominal pain   Risk factors: recent antibiotic use        Home Medications Prior to Admission medications   Medication Sig Start Date End Date Taking? Authorizing Provider  fluticasone (FLONASE) 50 MCG/ACT nasal spray  04/30/19   [provider]  ipratropium (ATROVENT) 0.06 % nasal spray Place 2 sprays into both nostrils 3 (three) times daily. 07/18/19   [provider]  levocetirizine (XYZAL) 5 MG tablet Take 5 mg by mouth daily. 07/27/19   [provider]      Allergies    Penicillins    Review of Systems   Review of Systems  Constitutional:  Positive for fever.  HENT:  Negative for sore throat.   Respiratory:  Negative for cough.    Cardiovascular:  Negative for chest pain.  Gastrointestinal:  Positive for diarrhea, nausea and vomiting. Negative for abdominal pain.  Musculoskeletal:  Positive for myalgias.  Skin:  Positive for rash.  Neurological:  Positive for syncope and light-headedness.    Physical Exam Updated Vital Signs BP (!) 131/90 (BP Location: Right Arm)   Pulse (!) 103   Temp 99.2 F (37.3 C)   Resp 18   Ht 6\' 1"  (1.854 m)   Wt 87.5 kg   SpO2 98%   BMI 25.46 kg/m  Physical Exam Vitals and nursing note reviewed.  Constitutional:      Appearance: Normal appearance. He is well-developed. He is ill-appearing and diaphoretic.  HENT:     Head: Normocephalic and atraumatic.     Mouth/Throat:     Comments: He has 1 aphthous ulcer that I can visualize on the buccal mucosa of his left cheek Eyes:     Conjunctiva/sclera: Conjunctivae normal.  Cardiovascular:     Rate and Rhythm: Regular rhythm. Tachycardia present.     Heart sounds: No murmur heard. Pulmonary:     Effort: Pulmonary effort is normal. No respiratory distress.     Breath sounds: Normal breath sounds.  Abdominal:     Palpations: Abdomen is soft.     Tenderness: There is no abdominal tenderness. There is no guarding or rebound.  Musculoskeletal:        General: Normal range of motion.     Cervical back: Neck supple.     Right lower leg: No edema.     Left lower leg: No edema.  Skin:    Capillary Refill: Capillary refill takes less than 2 seconds.     Coloration: Skin is pale.  Neurological:     General: No focal deficit present.     Mental Status: He is alert and oriented to person, place, and time.     Sensory: No sensory deficit.     Motor: No weakness.     ED Results / Procedures / Treatments   Labs (all labs ordered are listed, but only abnormal results are displayed) Labs Reviewed  COMPREHENSIVE METABOLIC PANEL - Abnormal; Notable for the following components:      Result Value   Sodium 134 (*)    Glucose, Bld 100  (*)    All other components within normal limits  URINALYSIS, ROUTINE W REFLEX MICROSCOPIC - Abnormal; Notable for the following components:   Specific Gravity, Urine 1.035 (*)    Ketones, ur TRACE (*)    Protein, ur 30 (*)    All other components within normal limits  SARS CORONAVIRUS 2 BY RT PCR  CULTURE, BLOOD (ROUTINE X 2)  CULTURE, BLOOD (ROUTINE X 2)  LIPASE, BLOOD  CBC  MAGNESIUM  LACTIC ACID, PLASMA    EKG None  Radiology No results found.  Procedures Procedures    Medications Ordered in ED Medications  lactated ringers bolus 1,000 mL (0 mLs Intravenous Stopped 02/18/22 2226)  lactated ringers bolus 1,000 mL (0 mLs Intravenous Stopped 02/19/22 0021)    ED Course/ Medical Decision Making/ A&P                           Medical Decision Making Amount and/or Complexity of Data Reviewed Labs: ordered.  Risk Prescription drug management.   This patient complains of nausea vomiting diarrhea near syncope; this involves an extensive number of treatment Options and is a complaint that carries with it a high risk of complications and morbidity. The differential includes dehydration, metabolic derangement, infection, arrhythmia  I ordered, reviewed and interpreted labs, which included CBC normal, chemistries fairly unremarkable, LFTs normal, COVID-negative, lactate normal I ordered medication IV fluids and reviewed PMP when indicated. Additional history obtained from patient significant other Previous records obtained and reviewed in epic no recent admissions Cardiac monitoring reviewed, normal sinus rhythm Social determinants considered, no significant barriers Critical Interventions: None  After the interventions stated above, I reevaluated the patient and found patient to be symptomatically improved and tolerating p.o. Admission and further testing considered, no indications for admission or further work-up at this time.  Stool studies and C. difficile ordered and  not obtained as patient was unable to produce a sample.  Recommended close follow-up with PCP.  Return instructions discussed         Final Clinical Impression(s) / ED Diagnoses Final diagnoses:  Nausea vomiting and diarrhea  Dehydration  Near syncope    Rx / DC Orders ED Discharge Orders          Ordered    loperamide (IMODIUM) 2 MG capsule  4 times daily PRN        02/18/22 2306    ondansetron (ZOFRAN) 4 MG tablet  Every 8 hours PRN        02/18/22 2306  Hayden Rasmussen, MD 02/19/22 1056

## 2022-02-18 NOTE — ED Notes (Signed)
Pt able to tolerate sips of water and ginger ale -- also has tolerated 6 saltine crackers and 2 oz applesauce -- UA collected and results pending- pt reports burning with urination and mild abd pain upon urination.  VSS

## 2022-02-18 NOTE — ED Notes (Signed)
ED Provider at bedside. 

## 2022-02-18 NOTE — Discharge Instructions (Signed)
You are seen in the emergency department for nausea vomiting diarrhea.  Your blood work was reassuring.  Your symptoms improved with some fluids.  You were unable to provide a stool sample to evaluate for C. difficile.  We are prescribing you some nausea medication and diarrhea medication.  Please drink plenty of fluids.  Follow-up with your regular doctor.  Return to the emergency department if any worsening or concerning symptoms.

## 2022-02-19 NOTE — ED Notes (Addendum)
Pt agreeable with d/c plan as discussed by provider- this nurse has verbally reinforced d/c instructions and provided pt with written copy - pt/wife acknowledges verbal understanding and denies any addl questions, concerns, needs- pt escorted to vehicle via w/c by this nurse; wife is driver - vitals stable; no acute distress/changes @d /c

## 2022-02-24 LAB — CULTURE, BLOOD (ROUTINE X 2)
Culture: NO GROWTH
Culture: NO GROWTH

## 2022-03-25 DIAGNOSIS — R7989 Other specified abnormal findings of blood chemistry: Secondary | ICD-10-CM | POA: Diagnosis not present

## 2022-03-25 DIAGNOSIS — Z0000 Encounter for general adult medical examination without abnormal findings: Secondary | ICD-10-CM | POA: Diagnosis not present

## 2022-04-01 DIAGNOSIS — Z1389 Encounter for screening for other disorder: Secondary | ICD-10-CM | POA: Diagnosis not present

## 2022-04-01 DIAGNOSIS — Z1331 Encounter for screening for depression: Secondary | ICD-10-CM | POA: Diagnosis not present

## 2022-04-01 DIAGNOSIS — Z0000 Encounter for general adult medical examination without abnormal findings: Secondary | ICD-10-CM | POA: Diagnosis not present

## 2022-06-11 DIAGNOSIS — M9903 Segmental and somatic dysfunction of lumbar region: Secondary | ICD-10-CM | POA: Diagnosis not present

## 2022-06-11 DIAGNOSIS — M9902 Segmental and somatic dysfunction of thoracic region: Secondary | ICD-10-CM | POA: Diagnosis not present

## 2022-06-16 DIAGNOSIS — M9902 Segmental and somatic dysfunction of thoracic region: Secondary | ICD-10-CM | POA: Diagnosis not present

## 2022-07-30 DIAGNOSIS — M9902 Segmental and somatic dysfunction of thoracic region: Secondary | ICD-10-CM | POA: Diagnosis not present

## 2022-08-03 DIAGNOSIS — M9903 Segmental and somatic dysfunction of lumbar region: Secondary | ICD-10-CM | POA: Diagnosis not present

## 2022-08-03 DIAGNOSIS — M9902 Segmental and somatic dysfunction of thoracic region: Secondary | ICD-10-CM | POA: Diagnosis not present

## 2022-09-06 ENCOUNTER — Encounter: Payer: Self-pay | Admitting: Physician Assistant

## 2022-09-29 DIAGNOSIS — M9902 Segmental and somatic dysfunction of thoracic region: Secondary | ICD-10-CM | POA: Diagnosis not present

## 2022-09-29 DIAGNOSIS — M9903 Segmental and somatic dysfunction of lumbar region: Secondary | ICD-10-CM | POA: Diagnosis not present

## 2022-10-28 ENCOUNTER — Ambulatory Visit: Payer: Federal, State, Local not specified - PPO | Admitting: Gastroenterology

## 2022-10-28 ENCOUNTER — Encounter: Payer: Self-pay | Admitting: Gastroenterology

## 2022-10-28 VITALS — BP 118/76 | HR 62 | Ht 73.0 in | Wt 207.0 lb

## 2022-10-28 DIAGNOSIS — R1310 Dysphagia, unspecified: Secondary | ICD-10-CM

## 2022-10-28 NOTE — Progress Notes (Signed)
I agree with the assessment and plan as outlined by Ms. Zehr. 

## 2022-10-28 NOTE — Patient Instructions (Signed)
You have been scheduled for an endoscopy. Please follow written instructions given to you at your visit today. If you use inhalers (even only as needed), please bring them with you on the day of your procedure.  _______________________________________________________  If your blood pressure at your visit was 140/90 or greater, please contact your primary care physician to follow up on this.  _______________________________________________________  If you are age 32 or older, your body mass index should be between 23-30. Your Body mass index is 27.31 kg/m. If this is out of the aforementioned range listed, please consider follow up with your Primary Care Provider.  If you are age 40 or younger, your body mass index should be between 19-25. Your Body mass index is 27.31 kg/m. If this is out of the aformentioned range listed, please consider follow up with your Primary Care Provider.   ________________________________________________________  The [facility] providers would like to encourage you to use [facility] to communicate with providers for non-urgent requests or questions.  Due to long hold times on the telephone, sending your provider a message by [facility] may be a faster and more efficient way to get a response.  Please allow 48 business hours for a response.  Please remember that this is for non-urgent requests.  _______________________________________________________

## 2022-10-28 NOTE — Progress Notes (Addendum)
10/28/2022 John Weber [id number] 1991/02/03   HISTORY OF PRESENT ILLNESS:  This is a 32 year old male who is new to our office and has been referred here by his PCP, Dr. Timothy Lasso, with complaints of dysphagia.  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  It appears that he had been on PPI and Flovent inhaler in the past, but he does not recall any of this.  Had endoscopy in 2020 by Dr. Loreta Ave, but we do not have those records.  He adamantly denies having any issues with heartburn/indigestion/acid reflux.  He says that all of this began in 2019 and has progressively worsened with time.  He says that now pretty much anything that he eats can get stuck.  Interestingly, on labs from November 2023 his eosinophil count is up at 8.9%.  Past Medical History:  Diagnosis Date   Sleep apnea    Syncope    vagal at restaurant   Past Surgical History:  Procedure Laterality Date   NASAL SEPTOPLASTY W/ TURBINOPLASTY Bilateral 12/25/2018   Procedure: NASAL SEPTOPLASTY WITH TURBINATE REDUCTION;  Surgeon: Newman Pies, MD;  Location: [facility];  Service: ENT;  Laterality: Bilateral;   WISDOM TOOTH EXTRACTION      reports that he has never smoked. He has never used smokeless tobacco. He reports that he does not drink alcohol and does not use drugs. family history includes Rectal cancer in his father. Allergies  Allergen Reactions   Penicillins Anaphylaxis      Outpatient Encounter Medications as of 10/28/2022  Medication Sig   [DISCONTINUED] fluticasone (FLONASE) 50 MCG/ACT nasal spray  (Patient not taking: Reported on 10/28/2022)   [DISCONTINUED] ipratropium (ATROVENT) 0.06 % nasal spray Place 2 sprays into both nostrils 3 (three) times daily. (Patient not taking: Reported on 10/28/2022)   [DISCONTINUED] levocetirizine (XYZAL) 5 MG tablet Take 5 mg by mouth daily. (Patient not taking: Reported on 10/28/2022)   [DISCONTINUED] loperamide (IMODIUM) 2 MG capsule Take 1 capsule  (2 mg total) by mouth 4 (four) times daily as needed for diarrhea or loose stools. (Patient not taking: Reported on 10/28/2022)   [DISCONTINUED] ondansetron (ZOFRAN) 4 MG tablet Take 1 tablet (4 mg total) by mouth every 8 (eight) hours as needed for nausea or vomiting. (Patient not taking: Reported on 10/28/2022)   No facility-administered encounter medications on file as of 10/28/2022.    REVIEW OF SYSTEMS  : All other systems reviewed and negative except where noted in the History of Present Illness.   PHYSICAL EXAM: BP 118/76 (BP Location: Left Arm, Patient Position: Sitting, Cuff Size: Normal)   Pulse 62   Ht 6\' 1"  (1.854 m)   Wt 207 lb (93.9 kg)   SpO2 98%   BMI 27.31 kg/m  General: Well developed white male in no acute distress Head: Normocephalic and atraumatic Eyes:  Sclerae anicteric, conjunctiva pink. Ears: Normal auditory acuity Lungs: Clear throughout to auscultation; no W/R/R. Heart: Regular rate and rhythm; no M/R/G. Abdomen: Soft, non-distended.  BS present.  Non-tender. Musculoskeletal: Symmetrical with no gross deformities  Skin: No lesions on visible extremities Extremities: No edema  Neurological: Alert oriented x 4, grossly non-focal Psychological:  Alert and cooperative. Normal mood and affect  ASSESSMENT AND PLAN: *32 year old male with complaints of dysphagia.  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  It appears that he had been on PPI and Flovent inhaler in the  past, but he does not recall any of this.  Had endoscopy in 2020 by Dr. Loreta Ave.  Will send for those records, but also needs follow-up with repeating an endoscopy at this time to see if he has any associated strictures that can be dilated and also to repeat biopsies to confirm the EOE diagnosis.  He adamantly denies any complaints of heartburn/reflux/indigestion so we will hold off on resuming any PPI therapy for now.  EGD with possible dilation being scheduled with Dr. Leonides Schanz.  The  risks, benefits, and alternatives to EGD were discussed with the patient and he consents to proceed.   **Addendum: EGD in October 2020 with Dr. Loreta Ave showed esophageal mucosal changes suggestive of EOE, 1 small mucosal nodule found in the distal esophagus above the Z-line was biopsied, normal stomach, mild duodenitis in the duodenal bulb.  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  The nodule returned as inflammatory squamous polyp with increased intraepithelial lymphocytes.   CC:  Creola Corn, MD

## 2022-11-01 ENCOUNTER — Encounter: Payer: Self-pay | Admitting: Internal Medicine

## 2022-11-01 ENCOUNTER — Ambulatory Visit (AMBULATORY_SURGERY_CENTER): Payer: Federal, State, Local not specified - PPO | Admitting: Internal Medicine

## 2022-11-01 VITALS — BP 103/60 | HR 67 | Temp 99.3°F | Resp 21 | Ht 73.0 in | Wt 207.0 lb

## 2022-11-01 DIAGNOSIS — R1310 Dysphagia, unspecified: Secondary | ICD-10-CM | POA: Diagnosis not present

## 2022-11-01 DIAGNOSIS — K2990 Gastroduodenitis, unspecified, without bleeding: Secondary | ICD-10-CM

## 2022-11-01 DIAGNOSIS — K200 Eosinophilic esophagitis: Secondary | ICD-10-CM | POA: Diagnosis not present

## 2022-11-01 DIAGNOSIS — K2289 Other specified disease of esophagus: Secondary | ICD-10-CM | POA: Diagnosis not present

## 2022-11-01 DIAGNOSIS — K319 Disease of stomach and duodenum, unspecified: Secondary | ICD-10-CM | POA: Diagnosis not present

## 2022-11-01 MED ORDER — PANTOPRAZOLE SODIUM 40 MG PO TBEC: 40.0000 mg | DELAYED_RELEASE_TABLET | Freq: Every day | ORAL | 1 refills | Status: DC

## 2022-11-01 MED ORDER — SODIUM CHLORIDE 0.9 % IV SOLN: 500.0000 mL | INTRAVENOUS | Status: DC

## 2022-11-01 NOTE — Patient Instructions (Signed)
YOU HAD AN ENDOSCOPIC PROCEDURE TODAY AT THE [facility]:   Refer to the procedure report that was given to you for any specific questions about what was found during the examination.  If the procedure report does not answer your questions, please call your gastroenterologist to clarify.  If you requested that your care partner not be given the details of your procedure findings, then the procedure report has been included in a sealed envelope for you to review at your convenience later.  YOU SHOULD EXPECT: Some feelings of bloating in the abdomen. Passage of more gas than usual.  Walking can help get rid of the air that was put into your GI tract during the procedure and reduce the bloating. If you had a lower endoscopy (such as a colonoscopy or flexible sigmoidoscopy) you may notice spotting of blood in your stool or on the toilet paper. If you underwent a bowel prep for your procedure, you may not have a normal bowel movement for a few days.  Please Note:  You might notice some irritation and congestion in your nose or some drainage.  This is from the oxygen used during your procedure.  There is no need for concern and it should clear up in a day or so.  SYMPTOMS TO REPORT IMMEDIATELY:   Following upper endoscopy (EGD)  Vomiting of blood or coffee ground material  New chest pain or pain under the shoulder blades  Painful or persistently difficult swallowing  New shortness of breath  Fever of 100F or higher  Black, tarry-looking stools  For urgent or emergent issues, a gastroenterologist can be reached at any hour by calling [phone]. Do not use MyChart messaging for urgent concerns.    DIET:  We do recommend a small meal at first, but then you may proceed to your regular diet.  Drink plenty of fluids but you should avoid alcoholic beverages for 24 hours.  ACTIVITY:  You should plan to take it easy for the rest of today and you should NOT DRIVE or use heavy machinery  until tomorrow (because of the sedation medicines used during the test).    FOLLOW UP: Our staff will call the number listed on your records the next business day following your procedure.  We will call around 7:15- 8:00 am to check on you and address any questions or concerns that you may have regarding the information given to you following your procedure. If we do not reach you, we will leave a message.     If any biopsies were taken you will be contacted by phone or by letter within the next 1-3 weeks.  Please call us at [phone] if you have not heard about the biopsies in 3 weeks.    SIGNATURES/CONFIDENTIALITY: You and/or your care partner have signed paperwork which will be entered into your electronic medical record.  These signatures attest to the fact that that the information above on your After Visit Summary has been reviewed and is understood.  Full responsibility of the confidentiality of this discharge information lies with you and/or your care-partner. 

## 2022-11-01 NOTE — Progress Notes (Signed)
Pt resting comfortably. VSS. Airway intact. SBAR complete to RN. All questions answered.   

## 2022-11-01 NOTE — Op Note (Signed)
[facility] Patient Name: John Weber Procedure Date: 11/01/2022 3:42 PM MRN: [id number] Endoscopist: Madelyn Brunner [place] , , [id number] Age: 32 Referring MD:  Date of Birth: July 14, 1990 Gender: Male Account #: [id number] Procedure:                Upper GI endoscopy Indications:              Dysphagia Medicines:                Monitored Anesthesia Care Procedure:                Pre-Anesthesia Assessment:                           - Prior to the procedure, a History and Physical                            was performed, and patient medications and                            allergies were reviewed. The patient's tolerance of                            previous anesthesia was also reviewed. The risks                            and benefits of the procedure and the sedation                            options and risks were discussed with the patient.                            All questions were answered, and informed consent                            was obtained. Prior Anticoagulants: The patient has                            taken no anticoagulant or antiplatelet agents. ASA                            Grade Assessment: II - A patient with mild systemic                            disease. After reviewing the risks and benefits,                            the patient was deemed in satisfactory condition to                            undergo the procedure.                           After obtaining informed consent, the endoscope was  passed under direct vision. Throughout the                            procedure, the patient's blood pressure, pulse, and                            oxygen saturations were monitored continuously. The                            Olympus scope [id number] was introduced through the                            mouth, and advanced to the second part of duodenum.                            The upper GI endoscopy was accomplished  without                            difficulty. The patient tolerated the procedure                            well. Scope In: Scope Out: Findings:                 Mucosal changes were found in the entire esophagus.                            Esophageal findings were graded using the                            Eosinophilic Esophagitis Endoscopic Reference Score                            (EoE-EREFS) as: Edema Grade 1 Present (decreased                            clarity or absence of vascular markings), Rings                            Grade 0 None (no ridges or rings seen), Exudates                            Grade 2 Severe (scattered white lesions involving                            10 percent or greater of the esophageal surface                            area), Furrows Grade 1 Mild (vertical lines without                            visible depth) and Stricture none (no stricture  found). Biopsies were obtained from the proximal                            and distal esophagus with cold forceps for                            histology of suspected eosinophilic esophagitis.                           Localized mild inflammation characterized by                            congestion (edema), erosions and erythema was found                            in the gastric antrum. Biopsies were taken with a                            cold forceps for histology.                           Localized inflammation characterized by congestion                            (edema), erythema and granularity was found in the                            duodenal bulb. Complications:            No immediate complications. Estimated Blood Loss:     Estimated blood loss was minimal. Impression:               - Esophageal mucosal changes suspicious for                            eosinophilic esophagitis.                           - Gastritis. Biopsied.                           -  Duodenitis.                           - Biopsies were taken with a cold forceps for                            evaluation of eosinophilic esophagitis. Recommendation:           - Discharge patient to home (with escort).                           - Await pathology results.                           - Use Protonix (pantoprazole) 40 mg PO BID for 10  weeks.                           - The findings and recommendations were discussed                            with the patient. Dr Particia Lather "Alan Ripper" Leonides Schanz,  11/01/2022 3:58:25 PM

## 2022-11-01 NOTE — Progress Notes (Signed)
   GASTROENTEROLOGY PROCEDURE H&P NOTE   Primary Care Physician: Creola Corn, MD    Reason for Procedure:   Dysphagia, possible history of EoE  Plan:    EGD  Patient is appropriate for endoscopic procedure(s) in the ambulatory ([facility]) setting.  The nature of the procedure, as well as the risks, benefits, and alternatives were carefully and thoroughly reviewed with the patient. Ample time for discussion and questions allowed. The patient understood, was satisfied, and agreed to proceed.     HPI: John Weber is a 32 y.o. male who presents for EGD for evaluation of dysphagia .  Patient was most recently seen in the Gastroenterology Clinic on 10/28/22.  No interval change in medical history since that appointment. Please refer to that note for full details regarding GI history and clinical presentation.   Past Medical History:  Diagnosis Date   Sleep apnea    Syncope    vagal at restaurant    Past Surgical History:  Procedure Laterality Date   NASAL SEPTOPLASTY W/ TURBINOPLASTY Bilateral 12/25/2018   Procedure: NASAL SEPTOPLASTY WITH TURBINATE REDUCTION;  Surgeon: Newman Pies, MD;  Location: [facility];  Service: ENT;  Laterality: Bilateral;   WISDOM TOOTH EXTRACTION      Prior to Admission medications   Not on File    No current outpatient medications on file.   Current Facility-Administered Medications  Medication Dose Route Frequency Provider Last Rate Last Admin   0.9 %  sodium chloride infusion  500 mL Intravenous Continuous Imogene Burn, MD        Allergies as of 11/01/2022 - Review Complete 11/01/2022  Allergen Reaction Noted   Penicillins Anaphylaxis 05/10/2016    Family History  Problem Relation Age of Onset   Kidney cancer Father    Stomach cancer Neg Hx    Barrett's esophagus Neg Hx    Colon cancer Neg Hx    Rectal cancer Neg Hx     Social History   Socioeconomic History   Marital status: Married    Spouse name: Not on file   Number  of children: Not on file   Years of education: Not on file   Highest education level: Not on file  Occupational History   Not on file  Tobacco Use   Smoking status: Never   Smokeless tobacco: Never  Vaping Use   Vaping Use: Never used  Substance and Sexual Activity   Alcohol use: No   Drug use: Never   Sexual activity: Not on file  Other Topics Concern   Not on file  Social History Narrative   Not on file   Social Determinants of Health   Financial Resource Strain: Not on file  Food Insecurity: Not on file  Transportation Needs: Not on file  Physical Activity: Not on file  Stress: Not on file  Social Connections: Not on file  Intimate Partner Violence: Not on file    Physical Exam: Vital signs in last 24 hours: BP 114/68   Pulse 68   Temp 99.3 F (37.4 C)   Ht 6\' 1"  (1.854 m)   Wt 207 lb (93.9 kg)   SpO2 100%   BMI 27.31 kg/m  GEN: NAD EYE: Sclerae anicteric ENT: MMM CV: Non-tachycardic Pulm: No increased WOB GI: Soft NEURO:  Alert & Oriented   Eulah Pont, MD [facility]   11/01/2022 3:31 PM

## 2022-11-01 NOTE — Progress Notes (Signed)
Patient reports no changes to health or medications since office visit.  

## 2022-11-01 NOTE — Progress Notes (Signed)
Called to room to assist during endoscopic procedure.  Patient ID and intended procedure confirmed with present staff. Received instructions for my participation in the procedure from the performing physician.  

## 2022-11-02 ENCOUNTER — Telehealth: Payer: Self-pay

## 2022-11-02 NOTE — Telephone Encounter (Signed)
  Follow up Call-     11/01/2022    2:46 PM  Call back number  Post procedure Call Back phone  # [phone]  Permission to leave phone message Yes     Patient questions:  Do you have a fever, pain , or abdominal swelling? No. Pain Score  0 *  Have you tolerated food without any problems? Yes.    Have you been able to return to your normal activities? Yes.    Do you have any questions about your discharge instructions: Diet   No. Medications  No. Follow up visit  No.  Do you have questions or concerns about your Care? No.  Actions: * If pain score is 4 or above: No action needed, pain <4.

## 2022-11-05 ENCOUNTER — Encounter: Payer: Self-pay | Admitting: Internal Medicine

## 2022-11-05 NOTE — Progress Notes (Signed)
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 He should remain on his pantoprazole 40 mg BID. Let's get him scheduled for an EGD in [facility] after he has been on the pantoprazole BID for 2 months (should be about 01/01/23). Thanks.

## 2022-11-08 ENCOUNTER — Telehealth: Payer: Self-pay | Admitting: Internal Medicine

## 2022-11-08 NOTE — Telephone Encounter (Signed)
Patient returned your call, requesting a  call back. Please advise. °

## 2022-11-09 ENCOUNTER — Other Ambulatory Visit: Payer: Self-pay

## 2022-11-09 DIAGNOSIS — K200 Eosinophilic esophagitis: Secondary | ICD-10-CM

## 2022-11-09 MED ORDER — PANTOPRAZOLE SODIUM 40 MG PO TBEC: 40.0000 mg | DELAYED_RELEASE_TABLET | Freq: Two times a day (BID) | ORAL | 2 refills | Status: DC

## 2022-12-16 DIAGNOSIS — R519 Headache, unspecified: Secondary | ICD-10-CM | POA: Diagnosis not present

## 2022-12-16 DIAGNOSIS — R051 Acute cough: Secondary | ICD-10-CM | POA: Diagnosis not present

## 2022-12-16 DIAGNOSIS — J029 Acute pharyngitis, unspecified: Secondary | ICD-10-CM | POA: Diagnosis not present

## 2022-12-16 DIAGNOSIS — R0982 Postnasal drip: Secondary | ICD-10-CM | POA: Diagnosis not present

## 2023-01-05 ENCOUNTER — Encounter: Payer: Self-pay | Admitting: Internal Medicine

## 2023-01-15 DIAGNOSIS — H1013 Acute atopic conjunctivitis, bilateral: Secondary | ICD-10-CM | POA: Diagnosis not present

## 2023-01-17 ENCOUNTER — Encounter: Payer: Federal, State, Local not specified - PPO | Admitting: Internal Medicine

## 2023-04-07 DIAGNOSIS — R7989 Other specified abnormal findings of blood chemistry: Secondary | ICD-10-CM | POA: Diagnosis not present

## 2023-04-14 DIAGNOSIS — G4733 Obstructive sleep apnea (adult) (pediatric): Secondary | ICD-10-CM | POA: Diagnosis not present

## 2023-04-14 DIAGNOSIS — Z0000 Encounter for general adult medical examination without abnormal findings: Secondary | ICD-10-CM | POA: Diagnosis not present

## 2023-04-14 DIAGNOSIS — Z1331 Encounter for screening for depression: Secondary | ICD-10-CM | POA: Diagnosis not present

## 2023-04-19 DIAGNOSIS — J019 Acute sinusitis, unspecified: Secondary | ICD-10-CM | POA: Diagnosis not present

## 2023-04-19 DIAGNOSIS — J209 Acute bronchitis, unspecified: Secondary | ICD-10-CM | POA: Diagnosis not present

## 2023-05-06 DIAGNOSIS — N4822 Cellulitis of corpus cavernosum and penis: Secondary | ICD-10-CM | POA: Diagnosis not present

## 2023-05-06 DIAGNOSIS — L739 Follicular disorder, unspecified: Secondary | ICD-10-CM | POA: Diagnosis not present

## 2023-05-08 ENCOUNTER — Encounter (HOSPITAL_BASED_OUTPATIENT_CLINIC_OR_DEPARTMENT_OTHER): Payer: Self-pay | Admitting: Emergency Medicine

## 2023-05-08 ENCOUNTER — Other Ambulatory Visit: Payer: Self-pay

## 2023-05-08 ENCOUNTER — Emergency Department (HOSPITAL_BASED_OUTPATIENT_CLINIC_OR_DEPARTMENT_OTHER)
Admission: EM | Admit: 2023-05-08 | Discharge: 2023-05-08 | Disposition: A | Discharge status: Home / Self Care | Payer: Federal, State, Local not specified - PPO

## 2023-05-08 DIAGNOSIS — L739 Follicular disorder, unspecified: Secondary | ICD-10-CM | POA: Diagnosis not present

## 2023-05-08 DIAGNOSIS — N489 Disorder of penis, unspecified: Secondary | ICD-10-CM | POA: Insufficient documentation

## 2023-05-08 MED ORDER — CLINDAMYCIN HCL 300 MG PO CAPS: 300.0000 mg | ORAL_CAPSULE | Freq: Three times a day (TID) | ORAL | 0 refills | Status: DC

## 2023-05-08 MED ORDER — CLINDAMYCIN HCL 300 MG PO CAPS: 300.0000 mg | ORAL_CAPSULE | Freq: Three times a day (TID) | ORAL | 0 refills | Status: AC

## 2023-05-08 MED ORDER — KETOROLAC TROMETHAMINE 10 MG PO TABS: 10.0000 mg | ORAL_TABLET | Freq: Four times a day (QID) | ORAL | 0 refills | Status: DC | PRN

## 2023-05-08 MED ORDER — SULFAMETHOXAZOLE-TRIMETHOPRIM 800-160 MG PO TABS
1.0000 | ORAL_TABLET | Freq: Once | ORAL | Status: AC
  Administered 2023-05-08: 1 via ORAL
  Filled 2023-05-08: qty 1

## 2023-05-08 MED ORDER — CLINDAMYCIN HCL 150 MG PO CAPS
300.0000 mg | ORAL_CAPSULE | Freq: Once | ORAL | Status: AC
  Administered 2023-05-08: 300 mg via ORAL
  Filled 2023-05-08: qty 2

## 2023-05-08 NOTE — ED Provider Notes (Signed)
[place] [facility] Provider Note   CSN: [id number] Arrival date & time: 05/08/23  6578     History  Chief Complaint  Patient presents with   Recurrent Skin Infections    John Weber is a 32 y.o. male.  32 year old male with no reported past medical history presenting to the emergency department today with pain and swelling over the right side of his penis.  The patient states that he did some grooming of his pubic hair few days ago.  He states that he started with some mild erythema after sexual intercourse with his wife.  The patient reports that has been married for years and denies any history of sexually transmitted infections or concerns for sex and transmitted infections.  He states that the erythema started few days ago.  He went to urgent care and was started on Bactrim.  He denies any fevers.  He states that he did have some redness that is since improved.  He woke up this morning and states that he noted some drainage from the area.  He came to the emergency department today primarily for this.  He states that he is having moderate intensity pain with this.  He denies any dysuria or penile discharge.        Home Medications Prior to Admission medications   Medication Sig Start Date End Date Taking? Authorizing Provider  pantoprazole (PROTONIX) 40 MG tablet Take 1 tablet (40 mg total) by mouth 2 (two) times daily before a meal. 11/09/22 01/08/23  Imogene Burn, MD      Allergies    Penicillins    Review of Systems   Review of Systems  Genitourinary:  Positive for penile pain and penile swelling.  All other systems reviewed and are negative.   Physical Exam Updated Vital Signs BP (!) 143/89 (BP Location: Left Arm)   Pulse 79   Temp 98.6 F (37 C) (Oral)   Resp 18   Wt 88.5 kg   SpO2 100%   BMI 25.73 kg/m  Physical Exam Vitals and nursing note reviewed.   General: no acute distress GU: There is enlarged hair  follicle noted at the base of the penis on the right.  The follicle measures about a half a centimeter.  The area surrounding is indurated and there is some surrounding erythema.  There is very minimal drainage noted  ED Results / Procedures / Treatments   Labs (all labs ordered are listed, but only abnormal results are displayed) Labs Reviewed - No data to display  EKG None  Radiology No results found.  Procedures Procedures    Medications Ordered in ED Medications  sulfamethoxazole-trimethoprim (BACTRIM DS) 800-160 MG per tablet 1 tablet (1 tablet Oral Given 05/08/23 0856)    ED Course/ Medical Decision Making/ A&P                                 Medical Decision Making 32 year old male presenting to the emergency department today with concern for what appears to be advanced folliculitis at the base of the penis here.  This does appear to be spontaneously draining here.  Given the erythema and persistent symptoms I will switch patient to clindamycin which should be better coverage for strep in addition to MRSA given the patient's severe penicillin allergy.  I did call and discussed case with Dr. Annabell Howells.  He sent the patient's information and secure  chat to arrange for very close outpatient follow-up.  I do not appreciate any significant abscess that requires additional incision and drainage today.  I think the patient is stable for discharge and is encouraged to follow-up with urology as an outpatient.  Risk Prescription drug management.           Final Clinical Impression(s) / ED Diagnoses Final diagnoses:  None    Rx / DC Orders ED Discharge Orders     None         Durwin Glaze, MD 05/08/23 [phone]

## 2023-05-08 NOTE — ED Triage Notes (Signed)
Ingrown hair on penis shaft x 1 week , drainage , was seen at [facility] , prescribed antibiotic , no relief . Reports swelling and pain . Denies penile discharge .

## 2023-05-08 NOTE — Discharge Instructions (Addendum)
Please start taking the clindamycin.  You can stop taking the Bactrim.  I did call and discussed your case with Dr. Annabell Howells.  Please call his office tomorrow to schedule follow-up.  Take the Toradol as needed for pain and inflammation.  He may also apply some warm compresses to the area every 4-6 hours to help with additional drainage.  Please follow-up tomorrow and return to the ER for worsening symptoms.

## 2023-10-12 DIAGNOSIS — R41840 Attention and concentration deficit: Secondary | ICD-10-CM | POA: Diagnosis not present

## 2023-10-18 DIAGNOSIS — K921 Melena: Secondary | ICD-10-CM | POA: Diagnosis not present

## 2023-10-21 DIAGNOSIS — F419 Anxiety disorder, unspecified: Secondary | ICD-10-CM | POA: Diagnosis not present

## 2023-10-21 DIAGNOSIS — R41840 Attention and concentration deficit: Secondary | ICD-10-CM | POA: Diagnosis not present

## 2023-12-13 DIAGNOSIS — J020 Streptococcal pharyngitis: Secondary | ICD-10-CM | POA: Diagnosis not present

## 2023-12-13 DIAGNOSIS — R051 Acute cough: Secondary | ICD-10-CM | POA: Diagnosis not present

## 2023-12-13 DIAGNOSIS — R5381 Other malaise: Secondary | ICD-10-CM | POA: Diagnosis not present

## 2023-12-13 DIAGNOSIS — O864 Pyrexia of unknown origin following delivery: Secondary | ICD-10-CM | POA: Diagnosis not present

## 2024-02-20 DIAGNOSIS — R251 Tremor, unspecified: Secondary | ICD-10-CM | POA: Diagnosis not present

## 2024-03-14 ENCOUNTER — Encounter: Payer: Self-pay | Admitting: Neurology

## 2024-04-23 NOTE — Progress Notes (Unsigned)
 Assessment/Plan:  Assessment and Plan Assessment & Plan Essential tremor Bilateral hand and leg tremor for six months, likely familial. Essential tremor can worsen but currently does not require medication. Anxiety can exacerbate tremor. Treatment based on personal preference and impact on daily life. - Monitor tremor for changes in severity or impact. -had labs at pcp.  Suspect TSH already done but if not, would recommend checking at next visit along with cmp. - Return for evaluation if tremor affects daily life or work. Return if new lateralizing s/s  Hyperreflexia, LE -likely physiologic as no s/s of myelopathy and no associated LBP       Subjective:   Discussed the use of AI scribe software for clinical note transcription with the patient, who gave verbal consent to proceed.  History of Present Illness John Weber is a 33 year old male who presents with tremor for evaluation. He was referred by Dr. Onita for evaluation of tremor.  He has been experiencing tremors for the past six+ months, affecting both hands and legs. The tremors began after significant weight loss over the past two years due to a calorie deficit diet. They occur even when he is not physically exerted. He does not notice any effect from caffeine or alcohol  consumption. He drinks one cup of English breakfast tea daily and consumes alcohol  infrequently, about one beer occasionally.  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 The tremors do not significantly impact his daily activities, such as eating or drinking, but he experiences difficulty with precise tasks like screwing in a screw, sometimes requiring the use of his other hand for assistance. His coworkers first noticed the tremors, prompting him to seek medical evaluation.  There is a family history of tremors on his father's  side, affecting his father, uncles, and a cousin, but none required medication or surgery. There is no known history of tremors on his mother's side. He is not currently taking any medications, supplements, or over-the-counter drugs, and he is allergic to penicillin.   Current/Previously tried tremor medications:None  Current medications that may exacerbate tremor:  None   Allergies  Allergen Reactions   Penicillins Anaphylaxis    No outpatient medications have been marked as taking for the 04/25/24 encounter (Office Visit) with [place] Talcott, Asberry RAMAN, DO.      Objective:   VITALS:   Vitals:   04/25/24 0952  BP: (!) 146/85  Pulse: 90  SpO2: 99%  Weight: 201 lb (91.2 kg)  Height: 6' 1 (1.854 m)   Gen:  Appears stated age and in NAD. HEENT:  Normocephalic, atraumatic. The mucous membranes are moist. The superficial temporal arteries are without ropiness or tenderness. Cardiovascular: Regular rate and rhythm. Lungs: Clear to auscultation bilaterally. Neck: There are no carotid bruits noted bilaterally.  NEUROLOGICAL:  Orientation:  The patient is alert and oriented x 3.   Cranial nerves: There is good facial symmetry. Extraocular muscles are intact and visual fields are full to confrontational testing. Speech is fluent and clear. Soft palate rises symmetrically and there is no tongue deviation. Hearing is intact to conversational tone. Tone: Tone is good throughout. Sensation: Sensation is intact to light touch touch throughout (facial, trunk, extremities). Vibration is intact at the bilateral big toe. There is no extinction with double simultaneous stimulation. There is no sensory dermatomal level identified. Coordination:  The patient has no dysdiadichokinesia or  dysmetria. Motor: Strength is 5/5 in the bilateral upper and lower extremities.  Shoulder shrug is equal bilaterally.  There is no pronator drift.  There are no fasciculations noted. DTR's: Deep tendon reflexes are 2/4 at  the bilateral biceps, triceps, brachioradialis, 2+-3 at the bilateral patella and achilles.  No ankle clonus.  Plantar responses are downgoing bilaterally. Gait and Station: The patient is able to ambulate without difficulty.   MOVEMENT EXAM: Tremor:  There is no rest tremor.  There is mild postural and intention tremor bilaterally.  No significant tremor when given a weight.  Able to pour water from one glass to another.    I have reviewed and interpreted the following labs independently   Chemistry      Component Value Date/Time   NA 134 (L) 02/18/2022 2031   K 3.7 02/18/2022 2031   CL 99 02/18/2022 2031   CO2 24 02/18/2022 2031   BUN 16 02/18/2022 2031   CREATININE 1.15 02/18/2022 2031      Component Value Date/Time   CALCIUM 9.4 02/18/2022 2031   ALKPHOS 48 02/18/2022 2031   AST 28 02/18/2022 2031   ALT 24 02/18/2022 2031   BILITOT 1.0 02/18/2022 2031      Lab Results  Component Value Date   WBC 10.4 02/18/2022   HGB 17.0 02/18/2022   HCT 47.6 02/18/2022   MCV 84.0 02/18/2022   PLT 236 02/18/2022   No results found for: TSH No labs were sent by primary care office for review.   Total time spent on today's visit was 45 minutes, including both face-to-face time and nonface-to-face time.  Time included that spent on review of records (prior notes available to me/labs/imaging if pertinent), discussing treatment and goals, answering patient's questions and coordinating care.  CC:  Onita Rush, MD

## 2024-04-25 ENCOUNTER — Ambulatory Visit: Admitting: Neurology

## 2024-04-25 ENCOUNTER — Encounter: Payer: Self-pay | Admitting: Neurology

## 2024-04-25 VITALS — BP 146/85 | HR 90 | Ht 73.0 in | Wt 201.0 lb

## 2024-04-25 DIAGNOSIS — G250 Essential tremor: Secondary | ICD-10-CM

## 2024-04-25 DIAGNOSIS — R292 Abnormal reflex: Secondary | ICD-10-CM | POA: Diagnosis not present

## 2024-04-25 NOTE — Patient Instructions (Signed)
  VISIT SUMMARY: Today, you were seen for an evaluation of tremors that you have been experiencing in your hands and legs for the past six months. These tremors began after significant weight loss and have been consistent regardless of your emotional state. You have a family history of tremors on your father's side. You also mentioned a recent back injury and an increase in blood pressure readings, which you attribute to stress.  YOUR PLAN: -ESSENTIAL TREMOR: Essential tremor is a nervous system disorder that causes involuntary and rhythmic shaking, often in the hands. Your tremors are likely familial and currently do not require medication. Anxiety can make the tremors worse. We will monitor the tremors for any changes in severity or impact on your daily life. Please return for evaluation if the tremors begin to affect your daily activities or work.  INSTRUCTIONS: Please monitor your tremors and note any changes in their severity or impact on your daily life. Return for evaluation if the tremors begin to affect your daily activities or work.                      Contains text generated by [employer]bridge.                                 Contains text generated by [employer]bridge.
